# Patient Record
Sex: Female | Born: 1950 | Race: Black or African American | Hispanic: No | Marital: Married | Smoking: Never smoker
Health system: Southern US, Community
[De-identification: ages and names within clinical notes are randomized; demographics above are authoritative.]

## PROBLEM LIST (undated history)

## (undated) DIAGNOSIS — I10 Essential (primary) hypertension: Secondary | ICD-10-CM

## (undated) HISTORY — PX: ABDOMINAL HYSTERECTOMY: SHX81

## (undated) HISTORY — PX: REPLACEMENT TOTAL KNEE: SUR1224

## (undated) HISTORY — PX: CATARACT EXTRACTION, BILATERAL: SHX1313

## (undated) HISTORY — PX: FOOT SURGERY: SHX648

---

## 2020-01-07 LAB — EXTERNAL GENERIC LAB PROCEDURE: COLOGUARD: NEGATIVE

## 2020-01-07 LAB — COLOGUARD: COLOGUARD: NEGATIVE

## 2020-08-14 ENCOUNTER — Emergency Department (HOSPITAL_BASED_OUTPATIENT_CLINIC_OR_DEPARTMENT_OTHER): Payer: Medicare Other

## 2020-08-14 ENCOUNTER — Other Ambulatory Visit: Payer: Self-pay

## 2020-08-14 ENCOUNTER — Inpatient Hospital Stay (HOSPITAL_BASED_OUTPATIENT_CLINIC_OR_DEPARTMENT_OTHER)
Admission: EM | Admit: 2020-08-14 | Discharge: 2020-08-16 | DRG: 177 | Disposition: A | Discharge status: Home / Self Care | Payer: Medicare Other | Attending: Internal Medicine | Admitting: Internal Medicine

## 2020-08-14 ENCOUNTER — Encounter (HOSPITAL_BASED_OUTPATIENT_CLINIC_OR_DEPARTMENT_OTHER): Payer: Self-pay | Admitting: Emergency Medicine

## 2020-08-14 DIAGNOSIS — E669 Obesity, unspecified: Secondary | ICD-10-CM | POA: Diagnosis present

## 2020-08-14 DIAGNOSIS — U071 COVID-19: Secondary | ICD-10-CM

## 2020-08-14 DIAGNOSIS — Z6838 Body mass index (BMI) 38.0-38.9, adult: Secondary | ICD-10-CM

## 2020-08-14 DIAGNOSIS — Z90710 Acquired absence of both cervix and uterus: Secondary | ICD-10-CM

## 2020-08-14 DIAGNOSIS — Z7982 Long term (current) use of aspirin: Secondary | ICD-10-CM

## 2020-08-14 DIAGNOSIS — Z79899 Other long term (current) drug therapy: Secondary | ICD-10-CM

## 2020-08-14 DIAGNOSIS — E785 Hyperlipidemia, unspecified: Secondary | ICD-10-CM | POA: Diagnosis present

## 2020-08-14 DIAGNOSIS — Z7984 Long term (current) use of oral hypoglycemic drugs: Secondary | ICD-10-CM

## 2020-08-14 DIAGNOSIS — R7303 Prediabetes: Secondary | ICD-10-CM | POA: Diagnosis present

## 2020-08-14 DIAGNOSIS — J1282 Pneumonia due to coronavirus disease 2019: Secondary | ICD-10-CM | POA: Diagnosis present

## 2020-08-14 DIAGNOSIS — I10 Essential (primary) hypertension: Secondary | ICD-10-CM | POA: Diagnosis present

## 2020-08-14 DIAGNOSIS — J9601 Acute respiratory failure with hypoxia: Secondary | ICD-10-CM | POA: Diagnosis present

## 2020-08-14 HISTORY — DX: Essential (primary) hypertension: I10

## 2020-08-14 LAB — COMPREHENSIVE METABOLIC PANEL
ALT: 24 U/L (ref 0–44)
AST: 26 U/L (ref 15–41)
Albumin: 4.2 g/dL (ref 3.5–5.0)
Alkaline Phosphatase: 97 U/L (ref 38–126)
Anion gap: 13 (ref 5–15)
BUN: 19 mg/dL (ref 8–23)
CO2: 27 mmol/L (ref 22–32)
Calcium: 9.7 mg/dL (ref 8.9–10.3)
Chloride: 99 mmol/L (ref 98–111)
Creatinine, Ser: 0.86 mg/dL (ref 0.44–1.00)
GFR, Estimated: >60 mL/min (ref >60)
Glucose, Bld: 95 mg/dL (ref 70–99)
Potassium: 3.8 mmol/L (ref 3.5–5.1)
Sodium: 139 mmol/L (ref 135–145)
Total Bilirubin: 0.2 mg/dL — ABNORMAL LOW (ref 0.3–1.2)
Total Protein: 7.9 g/dL (ref 6.5–8.1)

## 2020-08-14 LAB — CBC WITH DIFFERENTIAL/PLATELET
Abs Immature Granulocytes: 0.05 10*3/uL (ref 0.00–0.07)
Basophils Absolute: 0 10*3/uL (ref 0.0–0.1)
Basophils Relative: 0 %
Eosinophils Absolute: 0 10*3/uL (ref 0.0–0.5)
Eosinophils Relative: 0 %
HCT: 43 % (ref 36.0–46.0)
Hemoglobin: 13.8 g/dL (ref 12.0–15.0)
Immature Granulocytes: 0 %
Lymphocytes Relative: 26 %
Lymphs Abs: 3.5 10*3/uL (ref 0.7–4.0)
MCH: 29.4 pg (ref 26.0–34.0)
MCHC: 32.1 g/dL (ref 30.0–36.0)
MCV: 91.7 fL (ref 80.0–100.0)
Monocytes Absolute: 0.9 10*3/uL (ref 0.1–1.0)
Monocytes Relative: 7 %
Neutro Abs: 8.9 10*3/uL — ABNORMAL HIGH (ref 1.7–7.7)
Neutrophils Relative %: 67 %
Platelets: 256 10*3/uL (ref 150–400)
RBC: 4.69 MIL/uL (ref 3.87–5.11)
RDW: 12.7 % (ref 11.5–15.5)
WBC: 13.4 10*3/uL — ABNORMAL HIGH (ref 4.0–10.5)
nRBC: 0 % (ref 0.0–0.2)

## 2020-08-14 LAB — LACTIC ACID, PLASMA: Lactic Acid, Venous: 1.8 mmol/L (ref 0.5–1.9)

## 2020-08-14 LAB — RESP PANEL BY RT-PCR (FLU A&B, COVID) ARPGX2
Influenza A by PCR: NEGATIVE
Influenza B by PCR: NEGATIVE
SARS Coronavirus 2 by RT PCR: POSITIVE — AB

## 2020-08-14 LAB — FIBRINOGEN: Fibrinogen: 468 mg/dL (ref 210–475)

## 2020-08-14 LAB — C-REACTIVE PROTEIN: CRP: 0.8 mg/dL (ref <1.0)

## 2020-08-14 LAB — D-DIMER, QUANTITATIVE: D-Dimer, Quant: 0.43 ug/mL-FEU (ref 0.00–0.50)

## 2020-08-14 LAB — BRAIN NATRIURETIC PEPTIDE: B Natriuretic Peptide: 28.5 pg/mL (ref 0.0–100.0)

## 2020-08-14 LAB — FERRITIN: Ferritin: 30 ng/mL (ref 11–307)

## 2020-08-14 LAB — LACTATE DEHYDROGENASE: LDH: 199 U/L — ABNORMAL HIGH (ref 98–192)

## 2020-08-14 LAB — PROCALCITONIN: Procalcitonin: <0.1 ng/mL

## 2020-08-14 LAB — TRIGLYCERIDES: Triglycerides: 121 mg/dL (ref <150)

## 2020-08-14 MED ORDER — ASPIRIN 81 MG PO CHEW
81.0000 mg | CHEWABLE_TABLET | Freq: Once | ORAL | Status: AC
  Administered 2020-08-14: 81 mg via ORAL
  Filled 2020-08-14: qty 1

## 2020-08-14 MED ORDER — SODIUM CHLORIDE 0.9 % IV SOLN
100.0000 mg | INTRAVENOUS | Status: AC
  Administered 2020-08-14 (×2): 100 mg via INTRAVENOUS

## 2020-08-14 MED ORDER — ATORVASTATIN CALCIUM 40 MG PO TABS
40.0000 mg | ORAL_TABLET | Freq: Every day | ORAL | Status: DC
  Administered 2020-08-14 – 2020-08-16 (×3): 40 mg via ORAL
  Filled 2020-08-14 (×3): qty 1

## 2020-08-14 MED ORDER — ENALAPRIL MALEATE 10 MG PO TABS
10.0000 mg | ORAL_TABLET | Freq: Every day | ORAL | Status: DC
  Administered 2020-08-14 – 2020-08-16 (×3): 10 mg via ORAL
  Filled 2020-08-14: qty 1
  Filled 2020-08-14 (×3): qty 2

## 2020-08-14 MED ORDER — ALBUTEROL SULFATE HFA 108 (90 BASE) MCG/ACT IN AERS
8.0000 | INHALATION_SPRAY | Freq: Once | RESPIRATORY_TRACT | Status: AC
  Administered 2020-08-14: 8 via RESPIRATORY_TRACT
  Filled 2020-08-14: qty 6.7

## 2020-08-14 MED ORDER — ACETAMINOPHEN 325 MG PO TABS
650.0000 mg | ORAL_TABLET | ORAL | Status: DC | PRN
  Administered 2020-08-14 – 2020-08-16 (×3): 650 mg via ORAL
  Filled 2020-08-14 (×3): qty 2

## 2020-08-14 MED ORDER — LABETALOL HCL 200 MG PO TABS
200.0000 mg | ORAL_TABLET | Freq: Two times a day (BID) | ORAL | Status: DC
  Administered 2020-08-15 – 2020-08-16 (×3): 200 mg via ORAL
  Filled 2020-08-14 (×6): qty 1

## 2020-08-14 MED ORDER — ACETAMINOPHEN 325 MG PO TABS: 650.0000 mg | ORAL_TABLET | Freq: Once | ORAL | Status: AC

## 2020-08-14 MED ORDER — SODIUM CHLORIDE 0.9 % IV SOLN
100.0000 mg | Freq: Every day | INTRAVENOUS | Status: DC
  Administered 2020-08-15 – 2020-08-16 (×2): 100 mg via INTRAVENOUS
  Filled 2020-08-14: qty 20

## 2020-08-14 MED ORDER — METFORMIN HCL 500 MG PO TABS
1000.0000 mg | ORAL_TABLET | Freq: Every day | ORAL | Status: DC
  Administered 2020-08-15 – 2020-08-16 (×2): 1000 mg via ORAL
  Filled 2020-08-14 (×2): qty 2

## 2020-08-14 MED ORDER — AMLODIPINE BESYLATE 5 MG PO TABS
5.0000 mg | ORAL_TABLET | Freq: Every day | ORAL | Status: DC
  Administered 2020-08-14 – 2020-08-16 (×3): 5 mg via ORAL
  Filled 2020-08-14 (×3): qty 1

## 2020-08-14 MED ORDER — ACETAMINOPHEN 325 MG PO TABS
ORAL_TABLET | ORAL | Status: AC
  Administered 2020-08-14: 650 mg via ORAL
  Filled 2020-08-14: qty 2

## 2020-08-14 MED ORDER — DEXAMETHASONE SODIUM PHOSPHATE 10 MG/ML IJ SOLN
6.0000 mg | Freq: Once | INTRAMUSCULAR | Status: AC
  Administered 2020-08-14: 6 mg via INTRAVENOUS
  Filled 2020-08-14: qty 1

## 2020-08-14 NOTE — Progress Notes (Signed)
70 year old female with history of noninsulin-dependent type 2 diabetes, hypertension, hyperlipidemia presented with covid 19 symptoms for the past 1 and half weeks, reports sister passed away from Covid recently, she is tested positive for covid 19, Chest x-ray no acute infiltrate, hypoxia 87% on room air, inflammatory marker pending, she received remdesivir and steroid in the ED, accepted to med telemetry, currently no bed at [facility] or [facility], she is accepted to either campus first available.

## 2020-08-14 NOTE — ED Provider Notes (Signed)
[facility] EMERGENCY DEPARTMENT Provider Note   CSN: [id number] Arrival date & time: 08/14/20  0945     History Chief Complaint  Patient presents with  . Cough    Rhonda Gray is a 70 y.o. female with PMHx HTN who presents to the ED today with complaint of dry cough and nasal congestion with "sinuses" x 1.5 weeks.  Patient reports she has issues with her sinuses regularly.  She has been taking Tylenol cold and sinus without relief.  She states that her symptoms started a day after receiving the flu shot however she does also mention that her sister was recently admitted to the hospital on 2023-08-23 and ultimately passed away from COVID-19.  Patient is vaccinated with 2 doses however has not received her booster.  She denies any fevers or chills. No shortness of breath or chest pain.  The history is provided by the patient and medical records.       Past Medical History:  Diagnosis Date  . Hypertension     Patient Active Problem List   Diagnosis Date Noted  . COVID-19 08/14/2020    Past Surgical History:  Procedure Laterality Date  . ABDOMINAL HYSTERECTOMY       OB History   No obstetric history on file.     No family history on file.  Social History   Tobacco Use  . Smoking status: Never Smoker  . Smokeless tobacco: Never Used  Substance Use Topics  . Alcohol use: Not Currently    Home Medications Prior to Admission medications   Medication Sig Start Date End Date Taking? Authorizing Provider  bupivacaine (MARCAINE) 0.25 % injection Inject into the skin. 04/29/20  Yes [provider]  enalapril (VASOTEC) 10 MG tablet Take 10 mg by mouth daily.   Yes [provider]  labetalol (NORMODYNE) 200 MG tablet Take 200 mg by mouth 2 (two) times daily.   Yes [provider]  omeprazole (PRILOSEC) 40 MG capsule Take 40 mg by mouth daily.   Yes [provider]  amLODipine (NORVASC) 5 MG tablet Take by mouth.    [provider]  aspirin 81 MG EC tablet Take by mouth.    [provider]  atorvastatin (LIPITOR) 40 MG tablet Take 40 mg by mouth daily. 06/15/20   [provider]  Calcium Citrate-Vitamin D 250-200 MG-UNIT TABS Take by mouth.    [provider]  docusate sodium (COLACE) 100 MG capsule Take by mouth.    [provider]  estradiol (ESTRACE) 1 MG tablet Take 1 mg by mouth daily. 08/09/20   [provider]  metFORMIN (GLUCOPHAGE) 1000 MG tablet Take 1,000 mg by mouth daily. 05/31/20   [provider]    Allergies    Patient has no known allergies.  Review of Systems   Review of Systems  Constitutional: Negative for chills and fever.  HENT: Positive for congestion. Negative for ear pain and sore throat.   Respiratory: Positive for cough and wheezing. Negative for shortness of breath.   Neurological: Positive for headaches.    Physical Exam Updated Vital Signs BP (!) 168/61   Pulse 74   Temp 98.8 F (37.1 C) (Oral)   Resp 20   Ht 5' (1.524 m)   Wt 90.3 kg   SpO2 96%   BMI 38.86 kg/m   Physical Exam Vitals and nursing note reviewed.  Constitutional:      Appearance: She is obese. She is not ill-appearing or diaphoretic.  HENT:     Head: Normocephalic and atraumatic.     Nose: Congestion present.     Right Sinus: No maxillary sinus tenderness or frontal sinus tenderness.     Left Sinus: No maxillary sinus tenderness or frontal sinus tenderness.  Eyes:     Extraocular Movements: Extraocular movements intact.     Conjunctiva/sclera: Conjunctivae normal.     Pupils: Pupils are equal, round, and reactive to light.  Cardiovascular:     Rate and Rhythm: Normal rate and regular rhythm.  Pulmonary:     Effort: Pulmonary effort is normal.     Breath sounds: Wheezing present. No rhonchi or rales.     Comments: Speaking in full sentences without difficulty. Satting 96% on RA. Lungs with end expiratory wheezes throughout.  Skin:     General: Skin is warm and dry.     Coloration: Skin is not jaundiced.  Neurological:     General: No focal deficit present.     Mental Status: She is alert and oriented to person, place, and time. Mental status is at baseline.     ED Results / Procedures / Treatments   Labs (all labs ordered are listed, but only abnormal results are displayed) Labs Reviewed  RESP PANEL BY RT-PCR (FLU A&B, COVID) ARPGX2 - Abnormal; Notable for the following components:      Result Value   SARS Coronavirus 2 by RT PCR POSITIVE (*)    All other components within normal limits  CBC WITH DIFFERENTIAL/PLATELET - Abnormal; Notable for the following components:   WBC 13.4 (*)    Neutro Abs 8.9 (*)    All other components within normal limits  COMPREHENSIVE METABOLIC PANEL - Abnormal; Notable for the following components:   Total Bilirubin 0.2 (*)    All other components within normal limits  LACTATE DEHYDROGENASE - Abnormal; Notable for the following components:   LDH 199 (*)    All other components within normal limits  CULTURE, BLOOD (ROUTINE X 2)  CULTURE, BLOOD (ROUTINE X 2)  LACTIC ACID, PLASMA  D-DIMER, QUANTITATIVE (NOT AT [facility])  PROCALCITONIN  FERRITIN  TRIGLYCERIDES  FIBRINOGEN  C-REACTIVE PROTEIN  BRAIN NATRIURETIC PEPTIDE    EKG EKG Interpretation  Date/Time:  Saturday August 14 2020 12:17:44 EST Ventricular Rate:  64 PR Interval:    QRS Duration: 74 QT Interval:  398 QTC Calculation: 411 R Axis:   54 Text Interpretation: Sinus rhythm No old tracing to compare Confirmed by Meridee Score [phone]) on 08/14/2020 12:21:26 PM   Radiology DG Chest Port 1 View  Result Date: 08/14/2020 CLINICAL DATA:  Cough, congestion EXAM: PORTABLE CHEST 1 VIEW COMPARISON:  None. FINDINGS: Low lung volumes. Right base atelectasis. Left lung clear. Heart is normal size. No effusions or acute bony abnormality. IMPRESSION: Low lung volumes, right base atelectasis. Electronically Signed   By: Charlett Nose M.D.   On: 08/14/2020 12:08    Procedures Procedures (including critical care time)  Medications Ordered in ED Medications  remdesivir 100 mg in sodium chloride 0.9 % 100 mL IVPB (has no administration in time range)  albuterol (VENTOLIN HFA) 108 (90 Base) MCG/ACT inhaler 8 puff (8 puffs Inhalation Given 08/14/20 1202)  dexamethasone (DECADRON) injection 6 mg (6 mg Intravenous Given 08/14/20 1457)  remdesivir 100 mg in sodium chloride 0.9 % 100 mL IVPB (100 mg Intravenous New Bag/Given 08/14/20 1538)  acetaminophen (TYLENOL) tablet 650 mg (650 mg Oral Given 08/14/20 1539)    ED Course  I have reviewed the  triage vital signs and the nursing notes.  Pertinent labs & imaging results that were available during my care of the patient were reviewed by me and considered in my medical decision making (see chart for details).  Clinical Course as of 08/14/20 1705  Sat Aug 14, 2020  1335 SARS Coronavirus 2 by RT PCR(!): POSITIVE [MV]  [id number] 70 year old female here with cough and congestion for 1 week.  She is Covid vaccinated.  Unfortunately her sister recently passed away from Covid.  She is Covid positive here.  Room air sat is not bad but she desats with any type of ambulation.  Getting Covid labs and likely will need admission to the hospital for further management. [MB]    Clinical Course User Index [MB] Terrilee Files, MD [MV] Tanda Rockers, PA-C   MDM Rules/Calculators/A&P                          70 year old female who presents to the ED today complaining of cough, nasal congestion for the past 1.5 weeks secondary to receiving flu vaccine on 12/20 however was also around her sister who recently passed away from COVID-19.  She is vaccinated with 2 doses, no booster.  On arrival to the ED vitals are stable.  Patient is afebrile, nontachycardic and nontachypneic.  She appears to be in no acute distress.  She is noted to have nasal congestion on exam and diffuse end expiratory wheezes on  all lung fields.  She has no maxillary or frontal sinus tenderness palpation however reports she is dealing with "sinuses.".  Has no focal neuro deficits on exam today, I suspect she believes she is dealing with sinuses however she is having a headache and a cough.  Will test for Covid at this time as I have strong suspicion given her recent Covid exposure with her sister who ultimately passed away unfortunately.  We will also plan for chest x-ray.  Patient states she feels short of breath however she does not have any increased work of breathing on exam.  Will provide albuterol inhaler and reevaluate.  We will plan to ambulate patient to ensure she does not desaturate however EKG obtained due to complaint of shortness of breath.   Pt was ambulated and quickly desatted to 87% however once she sat back down it went back up. Given this feel she needs further work up - had initially sent the 6-24 hr COVID test however will change to 2 hour as pt may need admission.   COVID test has returned positive at this time. Do feel pt needs to be admitted. Will order COVID pre admission labs and consult for admission. Will order remdesivir in the setting of hypoxia with exertion and decadron.   Discussed case with Dr. Roda Shutters with [facility] who agrees to accept patient for admission.   This note was prepared using Dragon voice recognition software and may include unintentional dictation errors due to the inherent limitations of voice recognition software.  Daleysa Kristiansen was evaluated in Emergency Department on 08/14/2020 for the symptoms described in the history of present illness. She was evaluated in the context of the global COVID-19 pandemic, which necessitated consideration that the patient might be at risk for infection with the SARS-CoV-2 virus that causes COVID-19. Institutional protocols and algorithms that pertain to the evaluation of patients at risk for COVID-19 are in a state of rapid change based on  information released by regulatory bodies including the CDC and  federal and state organizations. These policies and algorithms were followed during the patient's care in the ED.  Final Clinical Impression(s) / ED Diagnoses Final diagnoses:  [id number]    Rx / DC Orders ED Discharge Orders    None       Eustaquio Maize, PA-C 08/14/20 1705    Hayden Rasmussen, MD 08/14/20 1742

## 2020-08-14 NOTE — Progress Notes (Signed)
Pt educated on MDI with spacer. 8 puffs given per MD order. Pt then ambulated on room air. Pre SpO2 was 95%, during ambulation SpO2 low as 88% and post SpO2 was 93%. Pt complains of SOB only with exertion. Pt also complains of congested, strong, non-productive cough. RT will continue to monitor and be available as needed.

## 2020-08-14 NOTE — ED Notes (Signed)
Pt took home dose 200 mg labetalol - will need another dose sent from pharmacy for AM

## 2020-08-14 NOTE — ED Notes (Signed)
Home dose labetalol not available - we do have dose of labetalol 200MG  IV - per Dr.Pickering, do not give IV. Nighttime dose held.

## 2020-08-14 NOTE — ED Triage Notes (Signed)
Cough and congestion x 1 week. Had her flu shot Monday. Fully vaccinated against covid.

## 2020-08-14 NOTE — ED Notes (Signed)
Family brought in meal for patient

## 2020-08-15 DIAGNOSIS — I10 Essential (primary) hypertension: Secondary | ICD-10-CM | POA: Diagnosis present

## 2020-08-15 DIAGNOSIS — J1282 Pneumonia due to coronavirus disease 2019: Secondary | ICD-10-CM | POA: Diagnosis present

## 2020-08-15 DIAGNOSIS — J9601 Acute respiratory failure with hypoxia: Secondary | ICD-10-CM | POA: Diagnosis present

## 2020-08-15 DIAGNOSIS — Z79899 Other long term (current) drug therapy: Secondary | ICD-10-CM | POA: Diagnosis not present

## 2020-08-15 DIAGNOSIS — U071 COVID-19: Secondary | ICD-10-CM | POA: Diagnosis present

## 2020-08-15 DIAGNOSIS — Z7984 Long term (current) use of oral hypoglycemic drugs: Secondary | ICD-10-CM | POA: Diagnosis not present

## 2020-08-15 DIAGNOSIS — E785 Hyperlipidemia, unspecified: Secondary | ICD-10-CM | POA: Diagnosis present

## 2020-08-15 DIAGNOSIS — Z90710 Acquired absence of both cervix and uterus: Secondary | ICD-10-CM | POA: Diagnosis not present

## 2020-08-15 DIAGNOSIS — R7303 Prediabetes: Secondary | ICD-10-CM | POA: Diagnosis present

## 2020-08-15 DIAGNOSIS — E669 Obesity, unspecified: Secondary | ICD-10-CM | POA: Diagnosis present

## 2020-08-15 DIAGNOSIS — Z6838 Body mass index (BMI) 38.0-38.9, adult: Secondary | ICD-10-CM | POA: Diagnosis not present

## 2020-08-15 DIAGNOSIS — Z7982 Long term (current) use of aspirin: Secondary | ICD-10-CM | POA: Diagnosis not present

## 2020-08-15 LAB — CBG MONITORING, ED: Glucose-Capillary: 106 mg/dL — ABNORMAL HIGH (ref 70–99)

## 2020-08-15 LAB — GLUCOSE, CAPILLARY: Glucose-Capillary: 137 mg/dL — ABNORMAL HIGH (ref 70–99)

## 2020-08-15 MED ORDER — DEXAMETHASONE 4 MG PO TABS
6.0000 mg | ORAL_TABLET | Freq: Every day | ORAL | Status: DC
  Administered 2020-08-16: 6 mg via ORAL
  Filled 2020-08-15: qty 2

## 2020-08-15 MED ORDER — POLYETHYLENE GLYCOL 3350 17 G PO PACK: 17.0000 g | PACK | Freq: Every day | ORAL | Status: DC | PRN

## 2020-08-15 MED ORDER — IPRATROPIUM-ALBUTEROL 20-100 MCG/ACT IN AERS
1.0000 | INHALATION_SPRAY | Freq: Four times a day (QID) | RESPIRATORY_TRACT | Status: DC | PRN
  Administered 2020-08-16: 1 via RESPIRATORY_TRACT
  Filled 2020-08-15: qty 4

## 2020-08-15 MED ORDER — GUAIFENESIN-DM 100-10 MG/5ML PO SYRP: 10.0000 mL | ORAL_SOLUTION | ORAL | Status: DC | PRN

## 2020-08-15 MED ORDER — PANTOPRAZOLE SODIUM 40 MG PO TBEC
80.0000 mg | DELAYED_RELEASE_TABLET | Freq: Every day | ORAL | Status: DC
  Administered 2020-08-16: 80 mg via ORAL
  Filled 2020-08-15: qty 2

## 2020-08-15 MED ORDER — BENZONATATE 100 MG PO CAPS
100.0000 mg | ORAL_CAPSULE | Freq: Three times a day (TID) | ORAL | Status: DC | PRN
  Administered 2020-08-15 – 2020-08-16 (×3): 100 mg via ORAL
  Filled 2020-08-15 (×3): qty 1

## 2020-08-15 MED ORDER — DOCUSATE SODIUM 100 MG PO CAPS
100.0000 mg | ORAL_CAPSULE | Freq: Every day | ORAL | Status: DC
  Administered 2020-08-16: 100 mg via ORAL
  Filled 2020-08-15 (×2): qty 1

## 2020-08-15 MED ORDER — ONDANSETRON HCL 4 MG PO TABS: 4.0000 mg | ORAL_TABLET | Freq: Four times a day (QID) | ORAL | Status: DC | PRN

## 2020-08-15 MED ORDER — INSULIN ASPART 100 UNIT/ML ~~LOC~~ SOLN: 0.0000 [IU] | Freq: Every day | SUBCUTANEOUS | Status: DC

## 2020-08-15 MED ORDER — INSULIN ASPART 100 UNIT/ML ~~LOC~~ SOLN: 0.0000 [IU] | Freq: Three times a day (TID) | SUBCUTANEOUS | Status: DC

## 2020-08-15 MED ORDER — ENOXAPARIN SODIUM 40 MG/0.4ML ~~LOC~~ SOLN
40.0000 mg | SUBCUTANEOUS | Status: DC
  Administered 2020-08-15: 40 mg via SUBCUTANEOUS
  Filled 2020-08-15: qty 0.4

## 2020-08-15 MED ORDER — ONDANSETRON HCL 4 MG/2ML IJ SOLN: 4.0000 mg | Freq: Four times a day (QID) | INTRAMUSCULAR | Status: DC | PRN

## 2020-08-15 NOTE — Progress Notes (Signed)
Yellow MEWS score.  Assessment done. Patient stable and asymptomatic. Admitting MD came and seen patient. Charge nurse, made aware. Will monitor patient according to [place] guidelines.   08/15/20 2010  Assess: MEWS Score  Temp 98.2 F (36.8 C)  BP (!) 204/109  Pulse Rate 80  Resp 16  Level of Consciousness Alert  SpO2 100 %  O2 Device Nasal Cannula  Assess: MEWS Score  MEWS Temp 0  MEWS Systolic 2  MEWS Pulse 0  MEWS RR 0  MEWS LOC 0  MEWS Score 2  MEWS Score Color Yellow

## 2020-08-15 NOTE — H&P (Signed)
History and Physical  Patient Name: Rhonda Gray     TDD:[id number]    DOB: 01-Jul-1951    DOA: 08/14/2020 PCP: Egbert Garibaldi, PA-C  Patient coming from: Home  Chief Complaint: Cough, nasal congestion for the past week and a half   HPI: Rhonda Gray is a 70 y.o. female with hx of hypertension, prediabetes, obesity, who initially presented to outside facility on 08/14/2020 secondary to cough and nasal congestion, was found to be Covid positive.  Patient states for the past week and a half she has had dry cough, facial/sinus congestion that has not improved despite over-the-counter medications, which it typically does with routine flares.  She has had issues with her sinuses in the past.  She has had 2 doses of Covid vaccine but did not receive her booster yet.  Denies any history of asthma, smoking, or any pulmonary disease.  She does not use oxygen or inhalers normally.  She has mild dyspnea on exertion.  Her sister recently passed away from Covid, however she says she was not by her lately.  She denies any chest pain, diarrhea, fevers.  ED course: -My initial presentation, patient afebrile, heart rate in the 80s, respiratory rate 20, blood pressure 150/80, O2 sat 92% on room air. -Relevant labs on initial presentation: Sodium 139, potassium 3.8, bicarb 27, BUN 19, creatinine 0.86, LDH 199, BNP 28, ferritin 30, CRP 0.8, procalcitonin negative, WBC 13.4, hemoglobin 13.8 -She was given remdesivir, Decadron, Tylenol, albuterol. -Given her dyspnea and desaturation (87%) with ambulation, patient was transferred for admission.     ROS: A complete and thorough review of systems obtained, negative unless stated in the HPI.      Past Medical History:  Diagnosis Date  . Hypertension     Past Surgical History:  Procedure Laterality Date  . ABDOMINAL HYSTERECTOMY      Social History: Patient lives at home.  The patient walks without assistance.  Non--smoker.  Allergies  Allergen Reactions   . Pedi-Pre Tape Spray [Wound Dressing Adhesive] Rash    PAPER TAPE GIVES A RASH, PLASTIC TAPE IS FINE TO USE    Family history: family history is not on file.  Prior to Admission medications   Medication Sig Start Date End Date Taking? Authorizing Provider  amLODipine (NORVASC) 5 MG tablet Take 5 mg by mouth daily.   Yes [provider]  aspirin 81 MG EC tablet Take 81 mg by mouth every evening.   Yes [provider]  atorvastatin (LIPITOR) 40 MG tablet Take 40 mg by mouth daily. 06/15/20  Yes [provider]  Calcium Citrate-Vitamin D 250-200 MG-UNIT TABS Take by mouth.   Yes [provider]  docusate sodium (COLACE) 100 MG capsule Take 100 mg by mouth daily.   Yes [provider]  enalapril (VASOTEC) 10 MG tablet Take 10 mg by mouth 2 (two) times daily.   Yes [provider]  estradiol (ESTRACE) 1 MG tablet Take 1 mg by mouth daily. 08/09/20  Yes [provider]  labetalol (NORMODYNE) 200 MG tablet Take 200 mg by mouth 2 (two) times daily.   Yes [provider]  metFORMIN (GLUCOPHAGE) 1000 MG tablet Take 1,000 mg by mouth daily. 05/31/20  Yes [provider]  omeprazole (PRILOSEC) 40 MG capsule Take 40 mg by mouth daily.   Yes [provider]  pioglitazone (ACTOS) 15 MG tablet Take 15 mg by mouth daily.   Yes [provider]       Physical Exam:  BP (!) 149/65 (BP Location: Right Arm)   Pulse 87   Temp 99 F (37.2 C) (Oral)   Resp 18   Ht 5' (1.524 m)   Wt 85.9 kg   SpO2 100%   BMI 36.98 kg/m  General appearance: Well-developed, adult female, alert and in  no acute respiratory distress Eyes: Anicteric, conjunctiva pink, lids and lashes normal. PERRL.    ENT: No nasal deformity, discharge, epistaxis.  Hearing intact.  Neck: No neck masses.  Trachea midline.  No thyromegaly/tenderness. Lymph: No cervical or supraclavicular lymphadenopathy. Skin: Warm and dry.  No jaundice.  No  suspicious rashes or lesions. Cardiac: RRR, nl S1-S2, no murmurs appreciated.. Trace LE edema.  Radial and pedal pulses 2+ and symmetric. Respiratory: Diminished breath sounds bilaterally, no significant wheezing noted Abdomen: Abdomen soft.  Nontender, bowel sounds present.  No ascites, distension, hepatosplenomegaly.   MSK: No deformities or effusions of the large joints of the upper or lower extremities bilaterally.  No cyanosis or clubbing. Neuro: Cranial nerves 2 through 12 grossly intact.  Sensation intact to light touch. Speech is fluent.  Muscle strength intact.    Psych: Sensorium intact and responding to questions, attention normal.  Behavior appropriate.  Affect within normal limits.  Judgment and insight appear normal.     Labs on Admission:  I have personally reviewed following labs and imaging studies: CBC: Recent Labs  Lab 08/14/20 1331  WBC 13.4*  NEUTROABS 8.9*  HGB 13.8  HCT 43.0  MCV 91.7  PLT 256   Basic Metabolic Panel: Recent Labs  Lab 08/14/20 1331  NA 139  K 3.8  CL 99  CO2 27  GLUCOSE 95  BUN 19  CREATININE 0.86  CALCIUM 9.7   GFR: Estimated Creatinine Clearance: 61.8 mL/min (by C-G formula based on SCr of 0.86 mg/dL).  Liver Function Tests: Recent Labs  Lab 08/14/20 1331  AST 26  ALT 24  ALKPHOS 97  BILITOT 0.2*  PROT 7.9  ALBUMIN 4.2   No results for input(s): LIPASE, AMYLASE in the last 168 hours. No results for input(s): AMMONIA in the last 168 hours. Coagulation Profile: No results for input(s): INR, PROTIME in the last 168 hours. Cardiac Enzymes: No results for input(s): CKTOTAL, CKMB, CKMBINDEX, TROPONINI in the last 168 hours. BNP (last 3 results) No results for input(s): PROBNP in the last 8760 hours. HbA1C: No results for input(s): HGBA1C in the last 72 hours. CBG: Recent Labs  Lab 08/15/20 0816 08/15/20 2145  GLUCAP 106* 137*   Lipid Profile: Recent Labs    08/14/20 1331  TRIG 121   Thyroid Function  Tests: No results for input(s): TSH, T4TOTAL, FREET4, T3FREE, THYROIDAB in the last 72 hours. Anemia Panel: Recent Labs    08/14/20 1331  FERRITIN 30     Recent Results (from the past 240 hour(s))  Resp Panel by RT-PCR (Flu A&B, Covid) Nasopharyngeal Swab     Status: Abnormal   Collection Time: 08/14/20 11:46 AM   Specimen: Nasopharyngeal Swab; Nasopharyngeal(NP) swabs in vial transport medium  Result Value Ref Range Status   SARS Coronavirus 2 by RT PCR POSITIVE (A) NEGATIVE Final    Comment: RESULT CALLED TO, READ BACK BY AND VERIFIED WITH: Heywood Bene RN [phone] [id number] PHILLIPS C (NOTE) SARS-CoV-2 target nucleic acids are DETECTED.  The SARS-CoV-2 RNA is generally detectable in upper respiratory specimens during the acute phase of infection. Positive results are indicative of the presence of the identified virus, but do not rule out bacterial  infection or co-infection with other pathogens not detected by the test. Clinical correlation with patient history and other diagnostic information is necessary to determine patient infection status. The expected result is Negative.  Fact Sheet for Patients: BloggerCourse.com  Fact Sheet for Healthcare Providers: SeriousBroker.it  This test is not yet approved or cleared by the [place] FDA and  has been authorized for detection and/or diagnosis of SARS-CoV-2 by FDA under an Emergency Use Authorization (EUA).  This EUA will remain in effect (meaning this test can be  used) for the duration of  the COVID-19 declaration under Section 564(b)(1) of the Act, 21 U.S.C. section 360bbb-3(b)(1), unless the authorization is terminated or revoked sooner.     Influenza A by PCR NEGATIVE NEGATIVE Final   Influenza B by PCR NEGATIVE NEGATIVE Final    Comment: (NOTE) The Xpert Xpress SARS-CoV-2/FLU/RSV plus assay is intended as an aid in the diagnosis of influenza from Nasopharyngeal swab  specimens and should not be used as a sole basis for treatment. Nasal washings and aspirates are unacceptable for Xpert Xpress SARS-CoV-2/FLU/RSV testing.  Fact Sheet for Patients: BloggerCourse.com  Fact Sheet for Healthcare Providers: SeriousBroker.it  This test is not yet approved or cleared by the [place] FDA and has been authorized for detection and/or diagnosis of SARS-CoV-2 by FDA under an Emergency Use Authorization (EUA). This EUA will remain in effect (meaning this test can be used) for the duration of the COVID-19 declaration under Section 564(b)(1) of the Act, 21 U.S.C. section 360bbb-3(b)(1), unless the authorization is terminated or revoked.  Performed at [facility], [address] [place], [place] [address]   Blood Culture (routine x 2)     Status: None (Preliminary result)   Collection Time: 08/14/20  1:32 PM   Specimen: BLOOD  Result Value Ref Range Status   Specimen Description   Final    BLOOD RIGHT ARM Performed at [facility], [address] [place], [place] [address]    Special Requests   Final    BOTTLES DRAWN AEROBIC AND ANAEROBIC Blood Culture adequate volume Performed at [facility], [address] [place], [place] [address]    Culture   Final    NO GROWTH < 24 HOURS Performed at [facility], [address], [place], [place] [address]    Report Status PENDING  Incomplete           Radiological Exams on Admission: Personally reviewed: No significant consolidation seen DG Chest Port 1 View  Result Date: 08/14/2020 CLINICAL DATA:  Cough, congestion EXAM: PORTABLE CHEST 1 VIEW COMPARISON:  None. FINDINGS: Low lung volumes. Right base atelectasis. Left lung clear. Heart is normal size. No effusions or acute bony abnormality. IMPRESSION: Low lung volumes, right base atelectasis. Electronically Signed   By: Charlett Nose M.D.   On: 08/14/2020 12:08       Assessment/Plan   1.  Acute hypoxic respiratory failure secondary to Covid pneumonitis -Patient desats 87% with ambulation -Covid positive on 08/14/2020 -Continue remdesivir, Decadron -breathing treatments and antitussives as needed -Given procalcitonin 0, and felt signs and symptoms point to viral pneumonia, we will not start antibiotics -Wean O2 as tolerated -Trend inflammatory markers  2.  Viral pneumonia secondary to Covid -See further plans above  3.  Essential hypertension -Resume home Norvasc, enalapril, labetalol -Continue to monitor blood pressure closely  4.  Prediabetes -Glucose checks, sliding scale ordered -Hemoglobin A1c ordered -Continue home metformin -ADA  diet     DVT prophylaxis: Lovenox Code Status: Full Family Communication: None Disposition Plan: Anticipate discharge home when stable Consults called: None Admission status: Inpatient      Medical decision making: Patient seen at 11:50 PM on 08/15/2020 What exists of the patient's chart was reviewed in depth and summarized above.  Clinical condition: Monitoring.        Laqueta Due [facility] Please page though AMION or Epic secure chat:  For password, contact charge nurse

## 2020-08-15 NOTE — Plan of Care (Signed)

## 2020-08-16 DIAGNOSIS — J1282 Pneumonia due to coronavirus disease 2019: Secondary | ICD-10-CM | POA: Diagnosis not present

## 2020-08-16 DIAGNOSIS — U071 COVID-19: Secondary | ICD-10-CM | POA: Diagnosis not present

## 2020-08-16 LAB — GLUCOSE, CAPILLARY
Glucose-Capillary: 102 mg/dL — ABNORMAL HIGH (ref 70–99)
Glucose-Capillary: 97 mg/dL (ref 70–99)

## 2020-08-16 LAB — CBC WITH DIFFERENTIAL/PLATELET
Abs Immature Granulocytes: 0.05 10*3/uL (ref 0.00–0.07)
Basophils Absolute: 0 10*3/uL (ref 0.0–0.1)
Basophils Relative: 0 %
Eosinophils Absolute: 0 10*3/uL (ref 0.0–0.5)
Eosinophils Relative: 0 %
HCT: 42.1 % (ref 36.0–46.0)
Hemoglobin: 13.3 g/dL (ref 12.0–15.0)
Immature Granulocytes: 0 %
Lymphocytes Relative: 31 %
Lymphs Abs: 4.6 10*3/uL — ABNORMAL HIGH (ref 0.7–4.0)
MCH: 29.6 pg (ref 26.0–34.0)
MCHC: 31.6 g/dL (ref 30.0–36.0)
MCV: 93.8 fL (ref 80.0–100.0)
Monocytes Absolute: 1 10*3/uL (ref 0.1–1.0)
Monocytes Relative: 6 %
Neutro Abs: 9.3 10*3/uL — ABNORMAL HIGH (ref 1.7–7.7)
Neutrophils Relative %: 63 %
Platelets: 246 10*3/uL (ref 150–400)
RBC: 4.49 MIL/uL (ref 3.87–5.11)
RDW: 12.4 % (ref 11.5–15.5)
WBC: 15 10*3/uL — ABNORMAL HIGH (ref 4.0–10.5)
nRBC: 0 % (ref 0.0–0.2)

## 2020-08-16 LAB — COMPREHENSIVE METABOLIC PANEL
ALT: 21 U/L (ref 0–44)
AST: 22 U/L (ref 15–41)
Albumin: 3.5 g/dL (ref 3.5–5.0)
Alkaline Phosphatase: 79 U/L (ref 38–126)
Anion gap: 10 (ref 5–15)
BUN: 20 mg/dL (ref 8–23)
CO2: 25 mmol/L (ref 22–32)
Calcium: 8.6 mg/dL — ABNORMAL LOW (ref 8.9–10.3)
Chloride: 106 mmol/L (ref 98–111)
Creatinine, Ser: 0.79 mg/dL (ref 0.44–1.00)
GFR, Estimated: >60 mL/min (ref >60)
Glucose, Bld: 99 mg/dL (ref 70–99)
Potassium: 3.7 mmol/L (ref 3.5–5.1)
Sodium: 141 mmol/L (ref 135–145)
Total Bilirubin: 0.4 mg/dL (ref 0.3–1.2)
Total Protein: 6.7 g/dL (ref 6.5–8.1)

## 2020-08-16 LAB — HEMOGLOBIN A1C
Hgb A1c MFr Bld: 5.7 % — ABNORMAL HIGH (ref 4.8–5.6)
Mean Plasma Glucose: 116.89 mg/dL

## 2020-08-16 LAB — D-DIMER, QUANTITATIVE: D-Dimer, Quant: <0.27 ug/mL-FEU (ref 0.00–0.50)

## 2020-08-16 LAB — PHOSPHORUS: Phosphorus: 3.2 mg/dL (ref 2.5–4.6)

## 2020-08-16 LAB — C-REACTIVE PROTEIN: CRP: 0.8 mg/dL (ref <1.0)

## 2020-08-16 LAB — FERRITIN: Ferritin: 25 ng/mL (ref 11–307)

## 2020-08-16 LAB — HIV ANTIBODY (ROUTINE TESTING W REFLEX): HIV Screen 4th Generation wRfx: NONREACTIVE

## 2020-08-16 LAB — MAGNESIUM: Magnesium: 1.7 mg/dL (ref 1.7–2.4)

## 2020-08-16 MED ORDER — FLUTICASONE PROPIONATE 50 MCG/ACT NA SUSP
1.0000 | Freq: Every day | NASAL | Status: DC
  Administered 2020-08-16: 1 via NASAL
  Filled 2020-08-16: qty 16

## 2020-08-16 NOTE — Progress Notes (Incomplete)
Patient's MEWS score Yellow. No changes from previous assessment. Vital signs c    08/16/20 0613  Assess: MEWS Score  Temp 98.8 F (37.1 C)  BP (!) 218/100  Pulse Rate 98  Resp 18  Level of Consciousness Alert  SpO2 100 %  O2 Device Room Air  Assess: MEWS Score  MEWS Temp 0  MEWS Systolic 2  MEWS Pulse 0  MEWS RR 0  MEWS LOC 0  MEWS Score 2  MEWS Score Color Yellow

## 2020-08-16 NOTE — Progress Notes (Signed)
SATURATION QUALIFICATIONS: (This note is used to comply with regulatory documentation for home oxygen)  Patient Saturations on Room Air at Rest = 97%  Patient Saturations on Room Air while Ambulating = 93%  Patient Saturations on 0 Liters of oxygen while Ambulating =93%

## 2020-08-16 NOTE — Discharge Summary (Signed)
Physician Discharge Summary  Rhonda Gray BJS:[id number] DOB: Apr 18, 1951 DOA: 08/14/2020  PCP: Doreen Salvage, PA-C  Admit date: 08/14/2020 Discharge date: 08/16/2020  Admitted From: Home Disposition: Home  Recommendations for Outpatient Follow-up:  1. Follow up with PCP in 1-2 weeks 2. Please obtain BMP/CBC in one week 3. Please follow up on the following pending results:  Home Health: None Equipment/Devices: None   Discharge Condition: Stable CODE STATUS: Full Diet recommendation: Diabetic diet  Brief/Interim Summary: Rhonda Gray is a 71 y.o. female with hx of hypertension, prediabetes, obesity, who initially presented to outside facility on 08/14/2020 secondary to cough and nasal congestion, was found to be Covid positive. Patient states for the past week and a half she has had dry cough, facial/sinus congestion that has not improved despite over-the-counter medications, which it typically does with routine flares.  She has had issues with her sinuses in the past.  She has had 2 doses of Covid vaccine but did not receive her booster yet.  Denies any history of asthma, smoking, or any pulmonary disease. She does not use oxygen or inhalers normally.  She has mild dyspnea on exertion. Her sister recently passed away from Covid, however she says she was not by her lately. She denies any chest pain, diarrhea, fevers.  Patient admitted as above with acute pneumonia and acute hypoxia in setting of COVID-19 pneumonia.  Fortunately due to patient's Covid vaccination status she improved drastically with supportive care, she did receive Remdesivir and steroids per protocol, but given her rapid improvement she is now on room air even with exertion denies any other symptoms feels back to baseline is otherwise stable and agreeable for discharge home.  Close follow-up with PCP in 1 to 2 weeks, we discussed ongoing need for quarantine to ensure no further spread of Covid to her family, coworkers or  acquaintances..  Discharge Diagnoses:  Principal Problem:   Pneumonia due to COVID-19 virus Active Problems:   Essential hypertension   Prediabetes   Acute respiratory failure with hypoxia [facility])    Discharge Instructions  Discharge Instructions    Call MD for:  difficulty breathing, headache or visual disturbances   Complete by: As directed    Diet - low sodium heart healthy   Complete by: As directed    Increase activity slowly   Complete by: As directed      Allergies as of 08/16/2020      Reactions   Pedi-pre Tape Spray [wound Dressing Adhesive] Rash   PAPER TAPE GIVES A RASH, PLASTIC TAPE IS FINE TO USE      Medication List    TAKE these medications   amLODipine 5 MG tablet Commonly known as: NORVASC Take 5 mg by mouth daily.   aspirin 81 MG EC tablet Take 81 mg by mouth every evening.   atorvastatin 40 MG tablet Commonly known as: LIPITOR Take 40 mg by mouth daily.   Calcium Citrate-Vitamin D 250-200 MG-UNIT Tabs Take by mouth.   docusate sodium 100 MG capsule Commonly known as: COLACE Take 100 mg by mouth daily.   enalapril 10 MG tablet Commonly known as: VASOTEC Take 10 mg by mouth 2 (two) times daily.   estradiol 1 MG tablet Commonly known as: ESTRACE Take 1 mg by mouth daily.   labetalol 200 MG tablet Commonly known as: NORMODYNE Take 200 mg by mouth 2 (two) times daily.   metFORMIN 1000 MG tablet Commonly known as: GLUCOPHAGE Take 1,000 mg by mouth daily.   omeprazole 40 MG  capsule Commonly known as: PRILOSEC Take 40 mg by mouth daily.   pioglitazone 15 MG tablet Commonly known as: ACTOS Take 15 mg by mouth daily.       Allergies  Allergen Reactions  . Pedi-Pre Tape Spray [Wound Dressing Adhesive] Rash    PAPER TAPE GIVES A RASH, PLASTIC TAPE IS FINE TO USE    Consultations:  None   Procedures/Studies: DG Chest Port 1 View  Result Date: 08/14/2020 CLINICAL DATA:  Cough, congestion EXAM: PORTABLE CHEST 1 VIEW COMPARISON:   None. FINDINGS: Low lung volumes. Right base atelectasis. Left lung clear. Heart is normal size. No effusions or acute bony abnormality. IMPRESSION: Low lung volumes, right base atelectasis. Electronically Signed   By: Charlett Nose M.D.   On: 08/14/2020 12:08     Subjective: No acute issues/events overnight, shortness of breath resolved. Feels quite well.   Discharge Exam: Vitals:   08/16/20 0658 08/16/20 1010  BP: (!) 194/81 (!) 198/79  Pulse: 81 95  Resp:  16  Temp:  98.9 F (37.2 C)  SpO2:  97%   Vitals:   08/16/20 0613 08/16/20 0655 08/16/20 0658 08/16/20 1010  BP: (!) 218/100  (!) 194/81 (!) 198/79  Pulse: 98  81 95  Resp: 18   16  Temp: 98.8 F (37.1 C)   98.9 F (37.2 C)  TempSrc: Oral   Oral  SpO2: 100% 95%  97%  Weight:      Height:        General: Pt is alert, awake, not in acute distress Cardiovascular: RRR, S1/S2 +, no rubs, no gallops Respiratory: CTA bilaterally, no wheezing, no rhonchi Abdominal: Soft, NT, ND, bowel sounds + Extremities: no edema, no cyanosis    The results of significant diagnostics from this hospitalization (including imaging, microbiology, ancillary and laboratory) are listed below for reference.     Microbiology: Recent Results (from the past 240 hour(s))  Resp Panel by RT-PCR (Flu A&B, Covid) Nasopharyngeal Swab     Status: Abnormal   Collection Time: 08/14/20 11:46 AM   Specimen: Nasopharyngeal Swab; Nasopharyngeal(NP) swabs in vial transport medium  Result Value Ref Range Status   SARS Coronavirus 2 by RT PCR POSITIVE (A) NEGATIVE Final    Comment: RESULT CALLED TO, READ BACK BY AND VERIFIED WITH: Heywood Bene RN [phone] [id number] PHILLIPS C (NOTE) SARS-CoV-2 target nucleic acids are DETECTED.  The SARS-CoV-2 RNA is generally detectable in upper respiratory specimens during the acute phase of infection. Positive results are indicative of the presence of the identified virus, but do not rule out bacterial infection or co-infection  with other pathogens not detected by the test. Clinical correlation with patient history and other diagnostic information is necessary to determine patient infection status. The expected result is Negative.  Fact Sheet for Patients: BloggerCourse.com  Fact Sheet for Healthcare Providers: SeriousBroker.it  This test is not yet approved or cleared by the [place] FDA and  has been authorized for detection and/or diagnosis of SARS-CoV-2 by FDA under an Emergency Use Authorization (EUA).  This EUA will remain in effect (meaning this test can be  used) for the duration of  the COVID-19 declaration under Section 564(b)(1) of the Act, 21 U.S.C. section 360bbb-3(b)(1), unless the authorization is terminated or revoked sooner.     Influenza A by PCR NEGATIVE NEGATIVE Final   Influenza B by PCR NEGATIVE NEGATIVE Final    Comment: (NOTE) The Xpert Xpress SARS-CoV-2/FLU/RSV plus assay is intended as an aid in the diagnosis of  influenza from Nasopharyngeal swab specimens and should not be used as a sole basis for treatment. Nasal washings and aspirates are unacceptable for Xpert Xpress SARS-CoV-2/FLU/RSV testing.  Fact Sheet for Patients: EntrepreneurPulse.com.au  Fact Sheet for Healthcare Providers: IncredibleEmployment.be  This test is not yet approved or cleared by the [place] FDA and has been authorized for detection and/or diagnosis of SARS-CoV-2 by FDA under an Emergency Use Authorization (EUA). This EUA will remain in effect (meaning this test can be used) for the duration of the COVID-19 declaration under Section 564(b)(1) of the Act, 21 U.S.C. section 360bbb-3(b)(1), unless the authorization is terminated or revoked.  Performed at [facility], [address] [place], [place] [address]   Blood Culture (routine x 2)     Status: None (Preliminary result)   Collection  Time: 08/14/20  1:32 PM   Specimen: BLOOD  Result Value Ref Range Status   Specimen Description   Final    BLOOD RIGHT ARM Performed at [facility], [address] [place], [place] [address]    Special Requests   Final    BOTTLES DRAWN AEROBIC AND ANAEROBIC Blood Culture adequate volume Performed at [facility], [address] [place], [place] [address]    Culture   Final    NO GROWTH < 24 HOURS Performed at [facility], [address] [place], [place] [address]    Report Status PENDING  Incomplete     Labs: BNP (last 3 results) Recent Labs    08/14/20 1331  BNP 00.3   Basic Metabolic Panel: Recent Labs  Lab 08/14/20 1331 08/16/20 0428  NA 139 141  K 3.8 3.7  CL 99 106  CO2 27 25  GLUCOSE 95 99  BUN 19 20  CREATININE 0.86 0.79  CALCIUM 9.7 8.6*  MG  --  1.7  PHOS  --  3.2   Liver Function Tests: Recent Labs  Lab 08/14/20 1331 08/16/20 0428  AST 26 22  ALT 24 21  ALKPHOS 97 79  BILITOT 0.2* 0.4  PROT 7.9 6.7  ALBUMIN 4.2 3.5   No results for input(s): LIPASE, AMYLASE in the last 168 hours. No results for input(s): AMMONIA in the last 168 hours. CBC: Recent Labs  Lab 08/14/20 1331 08/16/20 0428  WBC 13.4* 15.0*  NEUTROABS 8.9* 9.3*  HGB 13.8 13.3  HCT 43.0 42.1  MCV 91.7 93.8  PLT 256 246   Cardiac Enzymes: No results for input(s): CKTOTAL, CKMB, CKMBINDEX, TROPONINI in the last 168 hours. BNP: Invalid input(s): POCBNP CBG: Recent Labs  Lab 08/15/20 0816 08/15/20 2145 08/16/20 0810 08/16/20 1135  GLUCAP 106* 137* 102* 97   D-Dimer Recent Labs    08/14/20 1331 08/16/20 0428  DDIMER 0.43 <0.27   Hgb A1c Recent Labs    08/16/20 0428  HGBA1C 5.7*   Lipid Profile Recent Labs    08/14/20 1331  TRIG 121   Thyroid function studies No results for input(s): TSH, T4TOTAL, T3FREE, THYROIDAB in the last 72 hours.  Invalid input(s): FREET3 Anemia work up Recent Labs    08/14/20 1331  08/16/20 0428  FERRITIN 30 25   Urinalysis No results found for: COLORURINE, APPEARANCEUR, LABSPEC, [place], GLUCOSEU, [place], [place], [place], PROTEINUR, UROBILINOGEN, NITRITE, LEUKOCYTESUR Sepsis Labs Invalid input(s): PROCALCITONIN,  WBC,  LACTICIDVEN Microbiology Recent Results (from the past 240 hour(s))  Resp Panel by RT-PCR (Flu A&B, Covid) Nasopharyngeal Swab     Status: Abnormal  Collection Time: 08/14/20 11:46 AM   Specimen: Nasopharyngeal Swab; Nasopharyngeal(NP) swabs in vial transport medium  Result Value Ref Range Status   SARS Coronavirus 2 by RT PCR POSITIVE (A) NEGATIVE Final    Comment: RESULT CALLED TO, READ BACK BY AND VERIFIED WITH: Heywood Bene RN [phone] PHILLIPS C (NOTE) SARS-CoV-2 target nucleic acids are DETECTED.  The SARS-CoV-2 RNA is generally detectable in upper respiratory specimens during the acute phase of infection. Positive results are indicative of the presence of the identified virus, but do not rule out bacterial infection or co-infection with other pathogens not detected by the test. Clinical correlation with patient history and other diagnostic information is necessary to determine patient infection status. The expected result is Negative.  Fact Sheet for Patients: BloggerCourse.com  Fact Sheet for Healthcare Providers: SeriousBroker.it  This test is not yet approved or cleared by the [place] FDA and  has been authorized for detection and/or diagnosis of SARS-CoV-2 by FDA under an Emergency Use Authorization (EUA).  This EUA will remain in effect (meaning this test can be  used) for the duration of  the COVID-19 declaration under Section 564(b)(1) of the Act, 21 U.S.C. section 360bbb-3(b)(1), unless the authorization is terminated or revoked sooner.     Influenza A by PCR NEGATIVE NEGATIVE Final   Influenza B by PCR NEGATIVE NEGATIVE Final    Comment: (NOTE) The Xpert  Xpress SARS-CoV-2/FLU/RSV plus assay is intended as an aid in the diagnosis of influenza from Nasopharyngeal swab specimens and should not be used as a sole basis for treatment. Nasal washings and aspirates are unacceptable for Xpert Xpress SARS-CoV-2/FLU/RSV testing.  Fact Sheet for Patients: BloggerCourse.com  Fact Sheet for Healthcare Providers: SeriousBroker.it  This test is not yet approved or cleared by the [place] FDA and has been authorized for detection and/or diagnosis of SARS-CoV-2 by FDA under an Emergency Use Authorization (EUA). This EUA will remain in effect (meaning this test can be used) for the duration of the COVID-19 declaration under Section 564(b)(1) of the Act, 21 U.S.C. section 360bbb-3(b)(1), unless the authorization is terminated or revoked.  Performed at [facility], [address] [place], [place] [address]   Blood Culture (routine x 2)     Status: None (Preliminary result)   Collection Time: 08/14/20  1:32 PM   Specimen: BLOOD  Result Value Ref Range Status   Specimen Description   Final    BLOOD RIGHT ARM Performed at [facility], [address] [place], [place] [address]    Special Requests   Final    BOTTLES DRAWN AEROBIC AND ANAEROBIC Blood Culture adequate volume Performed at [facility], [address] [place], [place] [address]    Culture   Final    NO GROWTH < 24 HOURS Performed at [facility], [address] [place], [place] [address]    Report Status PENDING  Incomplete     Time coordinating discharge: Over 30 minutes  SIGNED:   Azucena Fallen, DO [facility] 08/16/2020, 12:26 PM Pager   If 7PM-7AM, please contact night-coverage [email or web address]

## 2020-08-19 LAB — CULTURE, BLOOD (ROUTINE X 2)
Culture: NO GROWTH
Special Requests: ADEQUATE

## 2020-11-30 NOTE — Progress Notes (Signed)
New Patient Note  RE: Rhonda Gray MRN: [id number] DOB: 1951-01-22 Date of Office Visit: 12/01/2020  Consult requested by: Perley Jain Primary care provider: Doreen Salvage, PA-C  Chief Complaint: Covid Positive (Patient had COVID 1/1st was in hospital for 3 days @ [place].)  History of Present Illness: I had the pleasure of seeing Rhonda Gray for initial evaluation at the [facility] on 12/01/2020. She is a 70 y.o. female, who is self-referred here for the evaluation of shortness of breath.  Respiratory: She reports symptoms of chest tightness, shortness of breath, wheezing, nocturnal awakenings for 4 months since she was hospitalized for COVID-19 pneumonia. Current medications include Combivent prn which help. She reports not using aerochamber with inhalers. She tried the following inhalers: none. Main triggers are infections. In the last month, frequency of symptoms: daily. Frequency of SABA use: 0x/week. Interference with physical activity: yes. In the last 12 months, emergency room visits/urgent care visits/doctor office visits or hospitalizations due to respiratory issues: once. In the last 12 months, oral steroids courses: one. Lifetime history of hospitalization for respiratory issues: once due to COVID-19 pneumonia. Prior intubations: no. History of pneumonia: once. She was not evaluated by allergist/pulmonologist in the past. Smoking exposure: no. Up to date with flu vaccine: yes. Up to date with COVID-19 vaccine: yes. Prior Covid-19 infection: yes. History of reflux: yes and takes medications for this. No prior cardiac issues.   08/14/2020 CXR: "Low lung volumes, right base atelectasis."  08/14/2020 hospitalization: "Rhonda Gray a 70 y.o.femalewith hx of hypertension, prediabetes, obesity,whoinitially presented to outside facility on 1/1/2022secondary to cough and nasal congestion, was found to be Covid positive. Patient states for the past  week and a half she has had dry cough, facial/sinus congestion that has not improved despite over-the-counter medications, which it typically does with routine flares. She has had issues with her sinuses in the past. She has had 2 doses of Covid vaccine but did not receive her booster yet. Denies any history of asthma, smoking, or any pulmonary disease. She does not use oxygen or inhalers normally. She has mild dyspnea on exertion. Her sister recently passed away from Covid, however she says she was not by her lately. She denies any chest pain, diarrhea, fevers.  Patient admitted as above with acute pneumonia and acute hypoxia in setting of COVID-19 pneumonia.  Fortunately due to patient's Covid vaccination status she improved drastically with supportive care, she did receive Remdesivir and steroids per protocol, but given her rapid improvement she is now on room air even with exertion denies any other symptoms feels back to baseline is otherwise stable and agreeable for discharge home.  Close follow-up with PCP in 1 to 2 weeks, we discussed ongoing need for quarantine to ensure no further spread of Covid to her family, coworkers or acquaintances.."  Assessment and Plan: Rhonda Gray is a 70 y.o. female with: Reactive airway disease without complication Persistent respiratory symptoms since COVID-19 pneumonia in January 2022 requiring hospitalization.  Tried Combivent as needed with some benefit.  No prior asthma/COPD diagnosis and no other inhaler use in the past.  Today's spirometry showed some restriction with 2% improvement in FEV1 post bronchodilator treatment.  Clinically feeling improved.  HPI concerning for post infectious reactive airway disease. . Daily controller medication(s): START Symbicort 2 puffs twice a day with spacer and rinse mouth afterwards. . Demonstrated proper use. Marland Kitchen Spacer given and demonstrated proper use with inhaler. Patient understood technique and all  questions/concerned  were addressed.  . May use albuterol rescue inhaler 2 puffs every 4 to 6 hours as needed for shortness of breath, chest tightness, coughing, and wheezing. May use albuterol rescue inhaler 2 puffs 5 to 15 minutes prior to strenuous physical activities. Monitor frequency of use.  . If no improvement with above regimen then recommend follow up with PCP to rule out any cardiac issues post COVID-19 infection.   Other allergic rhinitis Perennial rhinitis symptoms and no prior allergy/ENT evaluation.    Today's skin testing showed: Positive to dust mites.  Start environmental control measures as below.  May use over the counter antihistamines such as Zyrtec (cetirizine) 10mg  daily.   May use Flonase (fluticasone) nasal spray 1 spray per nostril twice a day as needed for nasal congestion.   Nasal saline spray (i.e., Simply Saline) or nasal saline lavage (i.e., NeilMed) is recommended as needed and prior to medicated nasal sprays.  Essential hypertension Elevated blood pressure reading in the office today.  Follow-up with PCP.  Return in about 2 months (around 01/31/2021).  Meds ordered this encounter  Medications  . fluticasone (FLONASE) 50 MCG/ACT nasal spray    Sig: Place 1 spray into both nostrils 2 (two) times daily as needed (for nasal congestion).    Dispense:  16 g    Refill:  5  . cetirizine (ZYRTEC ALLERGY) 10 MG tablet    Sig: Take 1 tablet (10 mg total) by mouth daily. For allergies    Dispense:  30 tablet    Refill:  5  . budesonide-formoterol (SYMBICORT) 80-4.5 MCG/ACT inhaler    Sig: Inhale 2 puffs into the lungs in the morning and at bedtime. with spacer and rinse mouth afterwards.    Dispense:  1 each    Refill:  5  . albuterol (VENTOLIN HFA) 108 (90 Base) MCG/ACT inhaler    Sig: Inhale 2 puffs into the lungs every 4 (four) hours as needed for wheezing or shortness of breath (coughing fits).    Dispense:  18 g    Refill:  1   Lab Orders  No  laboratory test(s) ordered today    Other allergy screening: Rhino conjunctivitis:  Some rhinitis symptoms and takes no medications for this. No prior ENT evaluation.   Food allergy: no Medication allergy: no Hymenoptera allergy: no Urticaria: no Eczema:no History of recurrent infections suggestive of immunodeficency: no  Diagnostics: Spirometry:  Tracings reviewed. Her effort: Good reproducible efforts. FVC: 1.54L FEV1: 1.22L, 71% predicted FEV1/FVC ratio: 79% Interpretation: Spirometry consistent with possible restrictive disease with 2% improvement in FEV1 post bronchodilator treatment. Clinically feeling improved.   Please see scanned spirometry results for details.  Skin Testing: Environmental allergy panel. Positive test to: dust mites.  Results discussed with patient/family.  Airborne Adult Perc - 12/01/20 1433    Time Antigen Placed 1433    Allergen Manufacturer Waynette ButteryGreer    Location Back    Number of Test 59    Panel 1 Select    1. Control-Buffer 50% Glycerol Negative    2. Control-Histamine 1 mg/ml 2+    3. Albumin saline Negative    4. Bahia Negative    5. French Southern TerritoriesBermuda Negative    6. Johnson Negative    7. Kentucky Blue Negative    8. Meadow Fescue Negative    9. Perennial Rye Negative    10. Sweet Vernal Negative    11. Timothy Negative    12. Cocklebur Negative    13. Burweed Marshelder Negative  14. Ragweed, short Negative    15. Ragweed, Giant Negative    16. Plantain,  English Negative    17. Lamb's Quarters Negative    18. Sheep Sorrell Negative    19. Rough Pigweed Negative    20. Marsh Elder, Rough Negative    21. Mugwort, Common Negative    22. Ash mix Negative    23. Birch mix Negative    24. Beech American Negative    25. Box, Elder Negative    26. Cedar, red Negative    27. Cottonwood, Guinea-Bissau Negative    28. Elm mix Negative    29. Hickory Negative    30. Maple mix Negative    31. Oak, Guinea-Bissau mix Negative    32. Pecan Pollen Negative     33. Pine mix Negative    34. Sycamore Eastern Negative    35. Walnut, Black Pollen Negative    36. Alternaria alternata Negative    37. Cladosporium Herbarum Negative    38. Aspergillus mix Negative    39. Penicillium mix Negative    40. Bipolaris sorokiniana (Helminthosporium) Negative    41. Drechslera spicifera (Curvularia) Negative    42. Mucor plumbeus Negative    43. Fusarium moniliforme Negative    44. Aureobasidium pullulans (pullulara) Negative    45. Rhizopus oryzae Negative    46. Botrytis cinera Negative    47. Epicoccum nigrum Negative    48. Phoma betae Negative    49. Candida Albicans Negative    50. Trichophyton mentagrophytes Negative    51. Mite, D Farinae  5,000 AU/ml 3+    52. Mite, D Pteronyssinus  5,000 AU/ml 3+    53. Cat Hair 10,000 BAU/ml Negative    54.  Dog Epithelia Negative    55. Mixed Feathers Negative    56. Horse Epithelia Negative    57. Cockroach, German Negative    58. Mouse Negative    59. Tobacco Leaf Negative           Past Medical History: Patient Active Problem List   Diagnosis Date Noted  . Reactive airway disease without complication 12/01/2020  . Other allergic rhinitis 12/01/2020  . Essential hypertension 08/15/2020  . Prediabetes 08/15/2020  . Acute respiratory failure with hypoxia (HCC) 08/15/2020  . Pneumonia due to COVID-19 virus 08/14/2020   Past Medical History:  Diagnosis Date  . Hypertension    Past Surgical History: Past Surgical History:  Procedure Laterality Date  . ABDOMINAL HYSTERECTOMY    . CATARACT EXTRACTION, BILATERAL Bilateral   . FOOT SURGERY Left   . REPLACEMENT TOTAL KNEE Right    Medication List:  Current Outpatient Medications  Medication Sig Dispense Refill  . albuterol (VENTOLIN HFA) 108 (90 Base) MCG/ACT inhaler Inhale 2 puffs into the lungs every 4 (four) hours as needed for wheezing or shortness of breath (coughing fits). 18 g 1  . amLODipine (NORVASC) 5 MG tablet Take 5 mg by mouth  daily.    Marland Kitchen aspirin 81 MG EC tablet Take 81 mg by mouth every evening.    Marland Kitchen atorvastatin (LIPITOR) 40 MG tablet Take 40 mg by mouth daily.    . budesonide-formoterol (SYMBICORT) 80-4.5 MCG/ACT inhaler Inhale 2 puffs into the lungs in the morning and at bedtime. with spacer and rinse mouth afterwards. 1 each 5  . Calcium Citrate-Vitamin D 250-200 MG-UNIT TABS Take by mouth.    . cetirizine (ZYRTEC ALLERGY) 10 MG tablet Take 1 tablet (10 mg total) by mouth daily. For  allergies 30 tablet 5  . docusate sodium (COLACE) 100 MG capsule Take 100 mg by mouth daily.    . enalapril (VASOTEC) 10 MG tablet Take 10 mg by mouth 2 (two) times daily.    Marland Kitchen estradiol (ESTRACE) 1 MG tablet Take 1 mg by mouth daily.    . fluticasone (FLONASE) 50 MCG/ACT nasal spray Place 1 spray into both nostrils 2 (two) times daily as needed (for nasal congestion). 16 g 5  . labetalol (NORMODYNE) 200 MG tablet Take 200 mg by mouth 2 (two) times daily.    . metFORMIN (GLUCOPHAGE) 1000 MG tablet Take 1,000 mg by mouth daily.    Marland Kitchen omeprazole (PRILOSEC) 40 MG capsule Take 40 mg by mouth daily.    . pioglitazone (ACTOS) 15 MG tablet Take 15 mg by mouth daily.     No current facility-administered medications for this visit.   Allergies: Allergies  Allergen Reactions  . Pedi-Pre Tape Spray [Wound Dressing Adhesive] Rash    PAPER TAPE GIVES A RASH, PLASTIC TAPE IS FINE TO USE   Social History: Social History   Socioeconomic History  . Marital status: Married    Spouse name: Not on file  . Number of children: Not on file  . Years of education: Not on file  . Highest education level: Not on file  Occupational History  . Not on file  Tobacco Use  . Smoking status: Never Smoker  . Smokeless tobacco: Never Used  Vaping Use  . Vaping Use: Never used  Substance and Sexual Activity  . Alcohol use: Never  . Drug use: Never  . Sexual activity: Not on file  Other Topics Concern  . Not on file  Social History Narrative  . Not  on file   Social Determinants of Health   Financial Resource Strain: Not on file  Food Insecurity: Not on file  Transportation Needs: Not on file  Physical Activity: Not on file  Stress: Not on file  Social Connections: Not on file   Lives in a 68 year old house. Smoking: denies Occupation: Comptroller HistorySurveyor, minerals in the house: no Engineer, civil (consulting) in the family room: no Carpet in the bedroom: no Heating: oil Cooling: window Pet: no  Family History: Family History  Problem Relation Age of Onset  . Asthma Mother   . Asthma Son   . Asthma Grandson   . Allergic rhinitis Neg Hx   . Angioedema Neg Hx   . Atopy Neg Hx   . Eczema Neg Hx   . Immunodeficiency Neg Hx   . Urticaria Neg Hx     Review of Systems  Constitutional: Negative for appetite change, chills, fever and unexpected weight change.  HENT: Positive for congestion. Negative for rhinorrhea.   Eyes: Negative for itching.  Respiratory: Positive for chest tightness and shortness of breath. Negative for cough and wheezing.   Cardiovascular: Negative for chest pain.  Gastrointestinal: Negative for abdominal pain.  Genitourinary: Negative for difficulty urinating.  Skin: Negative for rash.  Allergic/Immunologic: Positive for environmental allergies.  Neurological: Negative for headaches.   Objective: BP (!) 180/82 (Patient Position: Sitting)   Pulse 88   Temp 98.2 F (36.8 C) (Temporal)   Ht 4' 11.5" (1.511 m)   Wt 198 lb (89.8 kg)   SpO2 97%   BMI 39.32 kg/m  Body mass index is 39.32 kg/m. Physical Exam Vitals and nursing note reviewed.  Constitutional:      Appearance: Normal appearance. She is well-developed.  HENT:     Head: Normocephalic and atraumatic.     Right Ear: Tympanic membrane and external ear normal.     Left Ear: Tympanic membrane and external ear normal.     Nose: Nose normal.     Mouth/Throat:     Mouth: Mucous membranes are moist.     Pharynx: Oropharynx is  clear.  Eyes:     Conjunctiva/sclera: Conjunctivae normal.  Cardiovascular:     Rate and Rhythm: Normal rate and regular rhythm.     Heart sounds: Normal heart sounds. No murmur heard. No friction rub. No gallop.   Pulmonary:     Effort: Pulmonary effort is normal.     Breath sounds: Normal breath sounds. No wheezing, rhonchi or rales.  Musculoskeletal:     Cervical back: Neck supple.  Skin:    General: Skin is warm.     Findings: No rash.  Neurological:     Mental Status: She is alert and oriented to person, place, and time.  Psychiatric:        Behavior: Behavior normal.    The plan was reviewed with the patient/family, and all questions/concerned were addressed.  It was my pleasure to see Rhonda Gray today and participate in her care. Please feel free to contact me with any questions or concerns.  Sincerely,  Wyline Mood, DO Allergy & Immunology  [facility] office: [phone] [facility] office: [phone]

## 2020-12-01 ENCOUNTER — Encounter: Payer: Self-pay | Admitting: Allergy

## 2020-12-01 ENCOUNTER — Other Ambulatory Visit: Payer: Self-pay

## 2020-12-01 ENCOUNTER — Ambulatory Visit: Payer: Medicare Other | Admitting: Allergy

## 2020-12-01 VITALS — BP 180/82 | HR 88 | Temp 98.2°F | Ht 59.5 in | Wt 198.0 lb

## 2020-12-01 DIAGNOSIS — I10 Essential (primary) hypertension: Secondary | ICD-10-CM

## 2020-12-01 DIAGNOSIS — J45909 Unspecified asthma, uncomplicated: Secondary | ICD-10-CM

## 2020-12-01 DIAGNOSIS — J3089 Other allergic rhinitis: Secondary | ICD-10-CM | POA: Diagnosis not present

## 2020-12-01 DIAGNOSIS — J4551 Severe persistent asthma with (acute) exacerbation: Secondary | ICD-10-CM | POA: Diagnosis not present

## 2020-12-01 MED ORDER — CETIRIZINE HCL 10 MG PO TABS: 10.0000 mg | ORAL_TABLET | Freq: Every day | ORAL | 5 refills | Status: DC

## 2020-12-01 MED ORDER — BUDESONIDE-FORMOTEROL FUMARATE 80-4.5 MCG/ACT IN AERO: 2.0000 | INHALATION_SPRAY | Freq: Two times a day (BID) | RESPIRATORY_TRACT | 5 refills | Status: AC

## 2020-12-01 MED ORDER — ALBUTEROL SULFATE HFA 108 (90 BASE) MCG/ACT IN AERS: 2.0000 | INHALATION_SPRAY | RESPIRATORY_TRACT | 1 refills | Status: AC | PRN

## 2020-12-01 MED ORDER — FLUTICASONE PROPIONATE 50 MCG/ACT NA SUSP: 1.0000 | Freq: Two times a day (BID) | NASAL | 5 refills | Status: AC | PRN

## 2020-12-01 NOTE — Assessment & Plan Note (Signed)
Persistent respiratory symptoms since COVID-19 pneumonia in January 2022 requiring hospitalization.  Tried Combivent as needed with some benefit.  No prior asthma/COPD diagnosis and no other inhaler use in the past.  Today's spirometry showed some restriction with 2% improvement in FEV1 post bronchodilator treatment.  Clinically feeling improved.  HPI concerning for post infectious reactive airway disease. . Daily controller medication(s): START Symbicort 2 puffs twice a day with spacer and rinse mouth afterwards. . Demonstrated proper use. Marland Kitchen Spacer given and demonstrated proper use with inhaler. Patient understood technique and all questions/concerned were addressed.  . May use albuterol rescue inhaler 2 puffs every 4 to 6 hours as needed for shortness of breath, chest tightness, coughing, and wheezing. May use albuterol rescue inhaler 2 puffs 5 to 15 minutes prior to strenuous physical activities. Monitor frequency of use.  . If no improvement with above regimen then recommend follow up with PCP to rule out any cardiac issues post COVID-19 infection.

## 2020-12-01 NOTE — Assessment & Plan Note (Signed)
Elevated blood pressure reading in the office today.  Follow-up with PCP.

## 2020-12-01 NOTE — Assessment & Plan Note (Signed)
Perennial rhinitis symptoms and no prior allergy/ENT evaluation.    Today's skin testing showed: Positive to dust mites.  Start environmental control measures as below.  May use over the counter antihistamines such as Zyrtec (cetirizine) 10mg  daily.   May use Flonase (fluticasone) nasal spray 1 spray per nostril twice a day as needed for nasal congestion.   Nasal saline spray (i.e., Simply Saline) or nasal saline lavage (i.e., NeilMed) is recommended as needed and prior to medicated nasal sprays.

## 2020-12-01 NOTE — Patient Instructions (Addendum)
Today's skin testing showed: Positive to dust mites. Results given.  Breathing: . Daily controller medication(s): START Symbicort 2 puffs twice a day with spacer and rinse mouth afterwards. . Demonstrated proper use. Marland Kitchen Spacer given and demonstrated proper use with inhaler. Patient understood technique and all questions/concerned were addressed.   . May use albuterol rescue inhaler 2 puffs every 4 to 6 hours as needed for shortness of breath, chest tightness, coughing, and wheezing. May use albuterol rescue inhaler 2 puffs 5 to 15 minutes prior to strenuous physical activities. Monitor frequency of use.  . Breathing control goals:  o Full participation in all desired activities (may need albuterol before activity) o Albuterol use two times or less a week on average (not counting use with activity) o Cough interfering with sleep two times or less a month o Oral steroids no more than once a year o No hospitalizations  Environmental allergies  Start environmental control measures as below.  May use over the counter antihistamines such as Zyrtec (cetirizine) 10mg  daily.   May use Flonase (fluticasone) nasal spray 1 spray per nostril twice a day as needed for nasal congestion.   Nasal saline spray (i.e., Simply Saline) or nasal saline lavage (i.e., NeilMed) is recommended as needed and prior to medicated nasal sprays.  Follow up in 2 months or sooner if needed.   If shortness of breath is not improving - recommend follow up with PCP to rule out any heart issues.   Control of House Dust Mite Allergen . Dust mite allergens are a common trigger of allergy and asthma symptoms. While they can be found throughout the house, these microscopic creatures thrive in warm, humid environments such as bedding, upholstered furniture and carpeting. . Because so much time is spent in the bedroom, it is essential to reduce mite levels there.  . Encase pillows, mattresses, and box springs in special  allergen-proof fabric covers or airtight, zippered plastic covers.  . Bedding should be washed weekly in hot water (130 F) and dried in a hot dryer. Allergen-proof covers are available for comforters and pillows that can't be regularly washed.  the allergy-proof covers every few months. Minimize clutter in the bedroom. Keep pets out of the bedroom.  Reyes Ivan Keep humidity less than 50% by using a dehumidifier or air conditioning. You can buy a humidity measuring device called a hygrometer to monitor this.  . If possible, replace carpets with hardwood, linoleum, or washable area rugs. If that's not possible, vacuum frequently with a vacuum that has a HEPA filter. . Remove all upholstered furniture and non-washable window drapes from the bedroom. . Remove all non-washable stuffed toys from the bedroom.  Wash stuffed toys weekly.

## 2021-01-03 ENCOUNTER — Telehealth: Payer: Self-pay | Admitting: Allergy

## 2021-01-03 NOTE — Telephone Encounter (Signed)
Pt request a cheaper version of Symbicort, pt states it's too expensive.

## 2021-01-03 NOTE — Telephone Encounter (Signed)
Spoke with the patient and the pharmacy. Insurance will cover the Symbicort for $47. Order of Albuterol was also confirm and both Rx will be ready for pick up. Patient was notify and understanding was verbalized.

## 2021-02-01 ENCOUNTER — Ambulatory Visit: Payer: Medicare Other | Admitting: Family Medicine

## 2021-02-01 ENCOUNTER — Other Ambulatory Visit: Payer: Self-pay

## 2021-02-01 ENCOUNTER — Encounter: Payer: Self-pay | Admitting: Family Medicine

## 2021-02-01 VITALS — BP 170/86 | HR 72 | Temp 97.5°F | Resp 20

## 2021-02-01 DIAGNOSIS — I10 Essential (primary) hypertension: Secondary | ICD-10-CM

## 2021-02-01 DIAGNOSIS — J45909 Unspecified asthma, uncomplicated: Secondary | ICD-10-CM | POA: Diagnosis not present

## 2021-02-01 DIAGNOSIS — J3089 Other allergic rhinitis: Secondary | ICD-10-CM

## 2021-02-01 MED ORDER — MONTELUKAST SODIUM 10 MG PO TABS: 10.0000 mg | ORAL_TABLET | Freq: Every day | ORAL | 6 refills | Status: AC

## 2021-02-01 NOTE — Progress Notes (Signed)
[address] [place] [address] Dept: [phone]  FOLLOW UP NOTE  Patient ID: Rhonda Gray, female    DOB: December 10, 1950  Age: 70 y.o. MRN: [id number] Date of Office Visit: 02/01/2021  Assessment  Chief Complaint: Allergic Rhinitis  (a) and Asthma  HPI Rhonda Gray is a 70 year old female who presents to the clinic for follow-up visit.  She was last seen in this clinic on 12/01/2020 by Dr. Selena Batten for evaluation of asthma post COVID, allergic rhinitis, and hypertension.  At today's visit, she reports her asthma has been moderately well controlled with shortness of breath occurring with activity.  She denies wheeze and cough with activity and rest.  She denies overnight symptoms.  She denies shortness of breath with rest.  She continues Symbicort 80-2 puffs twice a day and has not used her albuterol since her last visit to this clinic.  She does report that she dislikes any inhaled medications.  Allergic rhinitis is reported as moderately well controlled with symptoms including occasional nasal congestion and postnasal drainage.  She continues cetirizine 10 mg once a day and uses Flonase as needed.  She is not currently using nasal saline rinses.  Reflux is reported as well controlled with no complaint of heartburn or vomiting.  She continues omeprazole 40 mg once a day.  Her current medications are listed in the chart. Of note, return demonstration of inhaler use indicates incorrect technique.  Drug Allergies:  Allergies  Allergen Reactions   Pedi-Pre Tape Spray [Wound Dressing Adhesive] Rash    PAPER TAPE GIVES A RASH, PLASTIC TAPE IS FINE TO USE    Physical Exam: BP (!) 170/86 (BP Location: Right Arm, Patient Position: Sitting, Cuff Size: Normal)   Pulse 72   Temp (!) 97.5 F (36.4 C) (Tympanic)   Resp 20   SpO2 99%    Physical Exam Vitals reviewed.  Constitutional:      Appearance: Normal appearance.  HENT:     Head: Normocephalic and atraumatic.     Right Ear:  Tympanic membrane normal.     Left Ear: Tympanic membrane normal.     Nose:     Comments: Bilateral nares edematous and pale with clear nasal drainage noted.  Pharynx normal.  Ears normal.  Eyes normal.    Mouth/Throat:     Pharynx: Oropharynx is clear.  Eyes:     Conjunctiva/sclera: Conjunctivae normal.  Cardiovascular:     Rate and Rhythm: Normal rate and regular rhythm.     Heart sounds: No murmur heard. Pulmonary:     Effort: Pulmonary effort is normal.     Breath sounds: Normal breath sounds.     Comments: Lungs clear to auscultation Musculoskeletal:        General: Normal range of motion.     Cervical back: Normal range of motion and neck supple.  Skin:    General: Skin is warm and dry.  Neurological:     Mental Status: She is alert and oriented to person, place, and time.  Psychiatric:        Mood and Affect: Mood normal.        Behavior: Behavior normal.        Thought Content: Thought content normal.        Judgment: Judgment normal.    Diagnostics: FVC 1.55, FEV1 1.21. Predicted FVC 2.16, predicted FEV1 1.71.  Spirometry indicates normal ventilatory function.  Assessment and Plan: 1. Reactive airway disease without complication, unspecified asthma severity, unspecified whether persistent  2. Essential hypertension   3. Perennial allergic rhinitis     Meds ordered this encounter  Medications   montelukast (SINGULAIR) 10 MG tablet    Sig: Take 1 tablet (10 mg total) by mouth at bedtime.    Dispense:  31 tablet    Refill:  6     Patient Instructions  Asthma Begin montelukast 10 mg once a day Continue Symbicort 80-2 puffs twice a day with a spacer to prevent cough or wheeze Continue albuterol 2 puffs once every 4 hours as needed for cough or wheeze You may use albuterol 2 puffs 5 to 15 minutes before activity to decrease cough or wheeze Move forward with a cardiac workup with your primary care provider  Allergic rhinitis Continue allergen avoidance  measures directed toward dust mite as listed below Continue cetirizine 10 mg once a day as needed for runny nose or itch.Remember to rotate to a different antihistamine about every 3 months. Some examples of over the counter antihistamines include Zyrtec (cetirizine), Xyzal (levocetirizine), Allegra (fexofenadine), and Claritin (loratidine).  Continue Flonase 1 to 2 sprays in each nostril once a day as needed for stuffy nose. In the right nostril, point the applicator out toward the right ear. In the left nostril, point the applicator out toward the left ear Consider saline nasal rinses as needed for nasal symptoms. Use this before any medicated nasal sprays for best result  Reflux Continue dietary lifestyle modifications as listed below Continue omeprazole as previously prescribed  Your blood pressure was elevated at today's visit. Follow up with your primary care provider about your blood pressure  Call the clinic if this treatment plan is not working well for you.  Follow up in 2 weeks or sooner if needed.   Return in about 2 weeks (around 02/15/2021), or if symptoms worsen or fail to improve.    Thank you for the opportunity to care for this patient.  Please do not hesitate to contact me with questions.  Thermon Leyland, FNP [facility]

## 2021-02-01 NOTE — Patient Instructions (Addendum)
Asthma Begin montelukast 10 mg once a day Continue Symbicort 80-2 puffs twice a day with a spacer to prevent cough or wheeze Continue albuterol 2 puffs once every 4 hours as needed for cough or wheeze You may use albuterol 2 puffs 5 to 15 minutes before activity to decrease cough or wheeze Move forward with a cardiac workup with your primary care provider  Allergic rhinitis Continue allergen avoidance measures directed toward dust mite as listed below Continue cetirizine 10 mg once a day as needed for runny nose or itch.Remember to rotate to a different antihistamine about every 3 months. Some examples of over the counter antihistamines include Zyrtec (cetirizine), Xyzal (levocetirizine), Allegra (fexofenadine), and Claritin (loratidine).  Continue Flonase 1 to 2 sprays in each nostril once a day as needed for stuffy nose. In the right nostril, point the applicator out toward the right ear. In the left nostril, point the applicator out toward the left ear Consider saline nasal rinses as needed for nasal symptoms. Use this before any medicated nasal sprays for best result  Reflux Continue dietary lifestyle modifications as listed below Continue omeprazole as previously prescribed  Your blood pressure was elevated at today's visit. Follow up with your primary care provider about your blood pressure  Call the clinic if this treatment plan is not working well for you.  Follow up in 2 weeks or sooner if needed.   Lifestyle Changes for Controlling GERD When you have GERD, stomach acid feels as if it's backing up toward your mouth. Whether or not you take medication to control your GERD, your symptoms can often be improved with lifestyle changes.   Raise Your Head Reflux is more likely to strike when you're lying down flat, because stomach fluid can flow backward more easily. Raising the head of your bed 4-6 inches can help. To do this: Slide blocks or books under the legs at the head of  your bed. Or, place a wedge under the mattress. Many foam stores can make a suitable wedge for you. The wedge should run from your waist to the top of your head. Don't just prop your head on several pillows. This increases pressure on your stomach. It can make GERD worse.  Watch Your Eating Habits Certain foods may increase the acid in your stomach or relax the lower esophageal sphincter, making GERD more likely. It's best to avoid the following: Coffee, tea, and carbonated drinks (with and without caffeine) Fatty, fried, or spicy food Mint, chocolate, onions, and tomatoes Any other foods that seem to irritate your stomach or cause you pain  Relieve the Pressure Eat smaller meals, even if you have to eat more often. Don't lie down right after you eat. Wait a few hours for your stomach to empty. Avoid tight belts and tight-fitting clothes. Lose excess weight.  Tobacco and Alcohol Avoid smoking tobacco and drinking alcohol. They can make GERD symptoms worse.  Control of Dust Mite Allergen Dust mites play a major role in allergic asthma and rhinitis. They occur in environments with high humidity wherever human skin is found. Dust mites absorb humidity from the atmosphere (ie, they do not drink) and feed on organic matter (including shed human and animal skin). Dust mites are a microscopic type of insect that you cannot see with the naked eye. High levels of dust mites have been detected from mattresses, pillows, carpets, upholstered furniture, bed covers, clothes, soft toys and any woven material. The principal allergen of the dust mite is found in its  feces. A gram of dust may contain 1,000 mites and 250,000 fecal particles. Mite antigen is easily measured in the air during house cleaning activities. Dust mites do not bite and do not cause harm to humans, other than by triggering allergies/asthma.  Ways to decrease your exposure to dust mites in your home:  1. Encase mattresses, box springs  and pillows with a mite-impermeable barrier or cover  2. Wash sheets, blankets and drapes weekly in hot water (130 F) with detergent and dry them in a dryer on the hot setting.  3. Have the room cleaned frequently with a vacuum cleaner and a damp dust-mop. For carpeting or rugs, vacuuming with a vacuum cleaner equipped with a high-efficiency particulate air (HEPA) filter. The dust mite allergic individual should not be in a room which is being cleaned and should wait 1 hour after cleaning before going into the room.  4. Do not sleep on upholstered furniture (eg, couches).  5. If possible removing carpeting, upholstered furniture and drapery from the home is ideal. Horizontal blinds should be eliminated in the rooms where the person spends the most time (bedroom, study, television room). Washable vinyl, roller-type shades are optimal.  6. Remove all non-washable stuffed toys from the bedroom. Wash stuffed toys weekly like sheets and blankets above.  7. Reduce indoor humidity to less than 50%. Inexpensive humidity monitors can be purchased at most hardware stores. Do not use a humidifier as can make the problem worse and are not recommended.

## 2021-03-14 ENCOUNTER — Encounter: Payer: Self-pay | Admitting: Family Medicine

## 2021-03-14 ENCOUNTER — Ambulatory Visit: Payer: Medicare Other | Admitting: Family Medicine

## 2021-03-14 ENCOUNTER — Other Ambulatory Visit: Payer: Self-pay

## 2021-03-14 VITALS — BP 158/70 | HR 88 | Temp 98.1°F | Resp 20

## 2021-03-14 DIAGNOSIS — J4540 Moderate persistent asthma, uncomplicated: Secondary | ICD-10-CM

## 2021-03-14 DIAGNOSIS — I10 Essential (primary) hypertension: Secondary | ICD-10-CM

## 2021-03-14 DIAGNOSIS — J3089 Other allergic rhinitis: Secondary | ICD-10-CM

## 2021-03-14 DIAGNOSIS — K219 Gastro-esophageal reflux disease without esophagitis: Secondary | ICD-10-CM

## 2021-03-14 MED ORDER — FAMOTIDINE 40 MG PO TABS: ORAL_TABLET | ORAL | 5 refills | Status: DC

## 2021-03-14 NOTE — Progress Notes (Addendum)
[address] [place] [address] Dept: [phone]  FOLLOW UP NOTE  Patient ID: Rhonda Gray, female    DOB: [birth year]  Age: 70 y.o. MRN: [id number] Date of Office Visit: 03/14/2021  Assessment  Chief Complaint: Follow-up and Asthma (Asthma symptoms have improved a little but still having occasional flare up during physical activities and hot weathers.)  HPI Rhonda Gray is a 70 year old female who presents the clinic for follow-up visit.  She was last seen in this clinic on 02/01/2021 for evaluation of asthma, allergic rhinitis, and reflux.  At her last visit, she started montelukast in addition to her previous asthma maintenance and rescue regimen.  At today's visit, she reports her asthma has been moderately well controlled with shortness of breath occurring after she stops any physical activity.  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  She denies cough.  She denies a new living situation, new carpeting or draperies, or new air fresheners.  She continues montelukast occasionally, Symbicort 80 at night about 3 days a week when she is wheezing, and uses her albuterol about once a week.  She reports that she has recently visited her cardiologist and had a twelve-lead EKG as well as echocardiogram that were within normal limits.  She has recently started taking triamtrene. She does report occasional ankle swelling occurring in the evening which resolves overnight.  She reports allergic rhinitis is moderately well controlled with occasional nasal congestion and postnasal drainage for which she continues cetirizine 10 mg once a day and uses Flonase as needed.  Reflux is reported as well controlled with no heartburn or vomiting.  She continues omeprazole 40 mg once a day before her first meal of the day.  Her current medications are listed in  the chart.  Of note, records have been requested for review.    Allergies  Allergen Reactions   Pedi-Pre Tape Spray [Wound Dressing Adhesive] Rash    PAPER TAPE GIVES A RASH, PLASTIC TAPE IS FINE TO USE    Physical Exam: BP (!) 158/70 (BP Location: Right Arm, Patient Position: Sitting, Cuff Size: Normal)   Pulse 88   Temp 98.1 F (36.7 C) (Temporal)   Resp 20   SpO2 99%    Physical Exam Vitals reviewed.  Constitutional:      Appearance: Normal appearance.  HENT:     Head: Normocephalic and atraumatic.     Right Ear: Tympanic membrane normal.     Left Ear: Tympanic membrane normal.     Nose:     Comments: Bilateral nares normal.  Pharynx normal.  Ears normal.  Eyes normal.    Mouth/Throat:     Pharynx: Oropharynx is clear.  Eyes:     Conjunctiva/sclera: Conjunctivae normal.  Cardiovascular:     Rate and Rhythm: Normal rate and regular rhythm.     Heart sounds: Normal heart sounds. No murmur heard. Pulmonary:     Effort: Pulmonary effort is normal.     Breath sounds: Normal breath sounds.     Comments: Lungs clear to auscultation Musculoskeletal:        General: Normal range of motion.     Cervical back: Normal range of motion and neck supple.  Skin:    General: Skin is warm and dry.  Neurological:     Mental Status: She is alert and oriented  to person, place, and time.  Psychiatric:        Mood and Affect: Mood normal.        Behavior: Behavior normal.        Thought Content: Thought content normal.        Judgment: Judgment normal.    Diagnostics: FVC 1.74, FEV1 1.38.  Predicted FVC 2.08, predicted FEV1 1.65.  Spirometry indicates normal ventilatory function.  Assessment and Plan: 1. Moderate persistent reactive airway disease without complication   2. Perennial allergic rhinitis   3. Gastroesophageal reflux disease, unspecified whether esophagitis present   4. Essential hypertension     Meds ordered this encounter  Medications   famotidine (PEPCID) 40  MG tablet    Sig: Take 1 tab in the evening for reflux control.    Dispense:  30 tablet    Refill:  5     Patient Instructions  Asthma Restart montelukast 10 mg once a day Begin AirDuo 113-1 puff twice a day to prevent cough or wheeze. This will replace Symbicort  Continue albuterol 2 puffs once every 4 hours as needed for cough or wheeze You may use albuterol 2 puffs 5 to 15 minutes before activity to decrease cough or wheeze  Allergic rhinitis Continue allergen avoidance measures directed toward dust mite as listed below Continue cetirizine 10 mg once a day as needed for runny nose or itch. Remember to rotate to a different antihistamine about every 3 months. Some examples of over the counter antihistamines include Zyrtec (cetirizine), Xyzal (levocetirizine), Allegra (fexofenadine), and Claritin (loratidine).  Continue Flonase 1 to 2 sprays in each nostril once a day as needed for stuffy nose. In the right nostril, point the applicator out toward the right ear. In the left nostril, point the applicator out toward the left ear Consider saline nasal rinses as needed for nasal symptoms. Use this before any medicated nasal sprays for best result  Reflux Begin Famotidine 40 mg once in the evening to control reflux Continue omeprazole 40 mg once a day as previously prescribed Continue dietary lifestyle modifications as listed below   Your blood pressure was elevated at today's visit. Follow up with your primary care provider about your blood pressure  Call the clinic if this treatment plan is not working well for you.  Follow up in 2 months or sooner if needed.   Return in about 2 months (around 05/14/2021), or if symptoms worsen or fail to improve.    Thank you for the opportunity to care for this patient.  Please do not hesitate to contact me with questions.  Thermon Leyland, FNP [facility]

## 2021-03-14 NOTE — Patient Instructions (Addendum)
Asthma Restart montelukast 10 mg once a day Begin AirDuo 113-1 puff twice a day to prevent cough or wheeze. This will replace Symbicort  Continue albuterol 2 puffs once every 4 hours as needed for cough or wheeze You may use albuterol 2 puffs 5 to 15 minutes before activity to decrease cough or wheeze  Allergic rhinitis Continue allergen avoidance measures directed toward dust mite as listed below Continue cetirizine 10 mg once a day as needed for runny nose or itch. Remember to rotate to a different antihistamine about every 3 months. Some examples of over the counter antihistamines include Zyrtec (cetirizine), Xyzal (levocetirizine), Allegra (fexofenadine), and Claritin (loratidine).  Continue Flonase 1 to 2 sprays in each nostril once a day as needed for stuffy nose. In the right nostril, point the applicator out toward the right ear. In the left nostril, point the applicator out toward the left ear Consider saline nasal rinses as needed for nasal symptoms. Use this before any medicated nasal sprays for best result  Reflux Begin Famotidine 40 mg once in the evening to control reflux Continue omeprazole 40 mg once a day as previously prescribed Continue dietary lifestyle modifications as listed below   Your blood pressure was elevated at today's visit. Follow up with your primary care provider about your blood pressure  Call the clinic if this treatment plan is not working well for you.  Follow up in 2 months or sooner if needed.   Lifestyle Changes for Controlling GERD When you have GERD, stomach acid feels as if it's backing up toward your mouth. Whether or not you take medication to control your GERD, your symptoms can often be improved with lifestyle changes.   Raise Your Head Reflux is more likely to strike when you're lying down flat, because stomach fluid can flow backward more easily. Raising the head of your bed 4-6 inches can help. To do this: Slide blocks or books under  the legs at the head of your bed. Or, place a wedge under the mattress. Many foam stores can make a suitable wedge for you. The wedge should run from your waist to the top of your head. Don't just prop your head on several pillows. This increases pressure on your stomach. It can make GERD worse.  Watch Your Eating Habits Certain foods may increase the acid in your stomach or relax the lower esophageal sphincter, making GERD more likely. It's best to avoid the following: Coffee, tea, and carbonated drinks (with and without caffeine) Fatty, fried, or spicy food Mint, chocolate, onions, and tomatoes Any other foods that seem to irritate your stomach or cause you pain  Relieve the Pressure Eat smaller meals, even if you have to eat more often. Don't lie down right after you eat. Wait a few hours for your stomach to empty. Avoid tight belts and tight-fitting clothes. Lose excess weight.  Tobacco and Alcohol Avoid smoking tobacco and drinking alcohol. They can make GERD symptoms worse.  Control of Dust Mite Allergen Dust mites play a major role in allergic asthma and rhinitis. They occur in environments with high humidity wherever human skin is found. Dust mites absorb humidity from the atmosphere (ie, they do not drink) and feed on organic matter (including shed human and animal skin). Dust mites are a microscopic type of insect that you cannot see with the naked eye. High levels of dust mites have been detected from mattresses, pillows, carpets, upholstered furniture, bed covers, clothes, soft toys and any woven material. The principal  allergen of the dust mite is found in its feces. A gram of dust may contain 1,000 mites and 250,000 fecal particles. Mite antigen is easily measured in the air during house cleaning activities. Dust mites do not bite and do not cause harm to humans, other than by triggering allergies/asthma.  Ways to decrease your exposure to dust mites in your home:  1. Encase  mattresses, box springs and pillows with a mite-impermeable barrier or cover  2. Wash sheets, blankets and drapes weekly in hot water (130 F) with detergent and dry them in a dryer on the hot setting.  3. Have the room cleaned frequently with a vacuum cleaner and a damp dust-mop. For carpeting or rugs, vacuuming with a vacuum cleaner equipped with a high-efficiency particulate air (HEPA) filter. The dust mite allergic individual should not be in a room which is being cleaned and should wait 1 hour after cleaning before going into the room.  4. Do not sleep on upholstered furniture (eg, couches).  5. If possible removing carpeting, upholstered furniture and drapery from the home is ideal. Horizontal blinds should be eliminated in the rooms where the person spends the most time (bedroom, study, television room). Washable vinyl, roller-type shades are optimal.  6. Remove all non-washable stuffed toys from the bedroom. Wash stuffed toys weekly like sheets and blankets above.  7. Reduce indoor humidity to less than 50%. Inexpensive humidity monitors can be purchased at most hardware stores. Do not use a humidifier as can make the problem worse and are not recommended.

## 2021-05-19 ENCOUNTER — Ambulatory Visit: Payer: Medicare Other | Admitting: Family

## 2021-05-24 ENCOUNTER — Ambulatory Visit: Payer: Medicare Other | Admitting: Family

## 2021-06-07 ENCOUNTER — Other Ambulatory Visit: Payer: Self-pay | Admitting: Allergy

## 2021-09-27 ENCOUNTER — Other Ambulatory Visit: Payer: Self-pay | Admitting: Family Medicine

## 2021-12-08 IMAGING — DX DG CHEST 1V PORT
1 series · 1 of 1 positions shown · non-contrast
Comparison: None.

CLINICAL DATA: Cough, congestion

EXAM:
PORTABLE CHEST 1 VIEW

[chest ap]
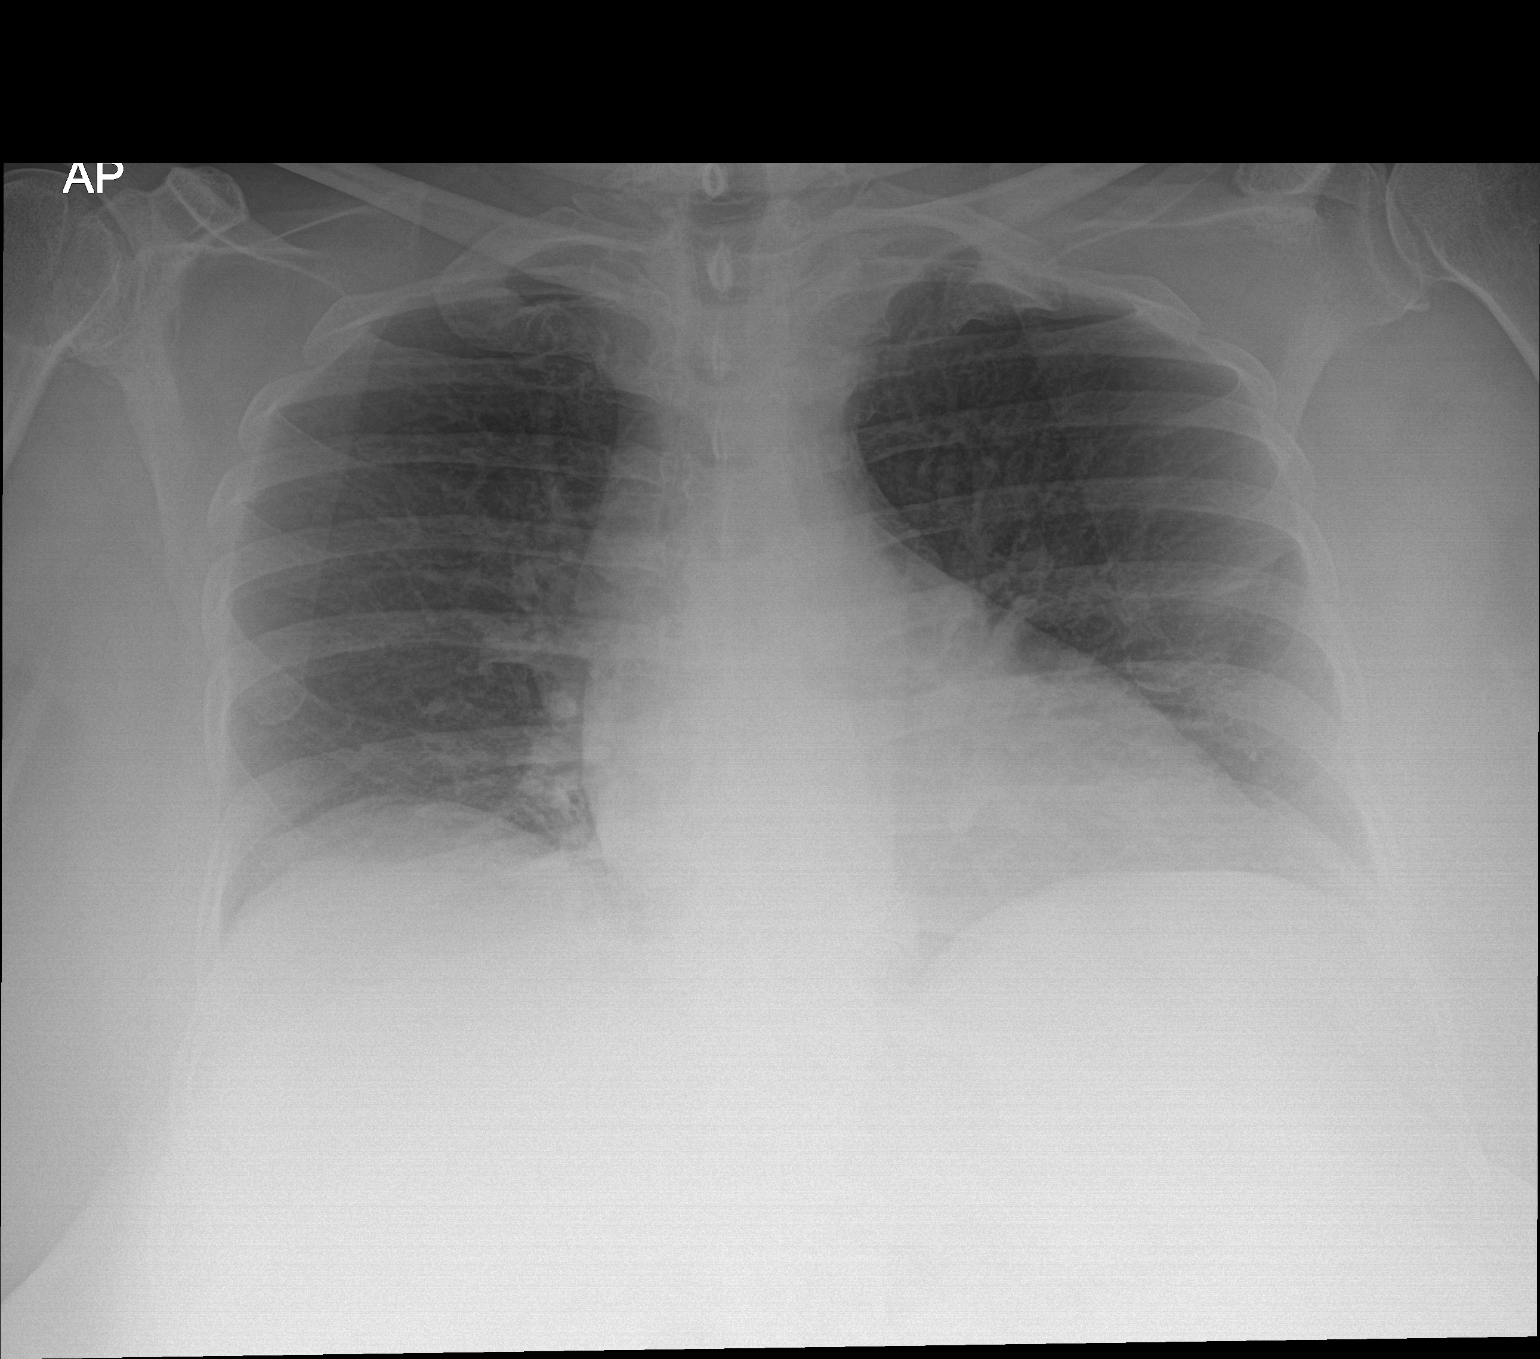

[1 of 1 positions shown; findings below may reference images not displayed]

FINDINGS: Low lung volumes. Right base atelectasis. Left lung clear. Heart is
normal size. No effusions or acute bony abnormality.
IMPRESSION: Low lung volumes, right base atelectasis.

## 2023-06-24 ENCOUNTER — Emergency Department (HOSPITAL_BASED_OUTPATIENT_CLINIC_OR_DEPARTMENT_OTHER): Payer: Medicare Other

## 2023-06-24 ENCOUNTER — Emergency Department (HOSPITAL_BASED_OUTPATIENT_CLINIC_OR_DEPARTMENT_OTHER)
Admission: EM | Admit: 2023-06-24 | Discharge: 2023-06-24 | Disposition: A | Discharge status: Home / Self Care | Payer: Medicare Other | Attending: Emergency Medicine | Admitting: Emergency Medicine

## 2023-06-24 ENCOUNTER — Other Ambulatory Visit: Payer: Self-pay

## 2023-06-24 ENCOUNTER — Encounter (HOSPITAL_BASED_OUTPATIENT_CLINIC_OR_DEPARTMENT_OTHER): Payer: Self-pay | Admitting: Emergency Medicine

## 2023-06-24 DIAGNOSIS — Z79899 Other long term (current) drug therapy: Secondary | ICD-10-CM | POA: Diagnosis not present

## 2023-06-24 DIAGNOSIS — M25561 Pain in right knee: Secondary | ICD-10-CM | POA: Insufficient documentation

## 2023-06-24 DIAGNOSIS — I10 Essential (primary) hypertension: Secondary | ICD-10-CM | POA: Diagnosis not present

## 2023-06-24 DIAGNOSIS — Z7982 Long term (current) use of aspirin: Secondary | ICD-10-CM | POA: Diagnosis not present

## 2023-06-24 MED ORDER — HYDROCODONE-ACETAMINOPHEN 5-325 MG PO TABS
1.0000 | ORAL_TABLET | Freq: Once | ORAL | Status: AC
  Administered 2023-06-24: 1 via ORAL
  Filled 2023-06-24: qty 1

## 2023-06-24 MED ORDER — HYDROCODONE-ACETAMINOPHEN 5-325 MG PO TABS: 1.0000 | ORAL_TABLET | Freq: Four times a day (QID) | ORAL | 0 refills | Status: DC | PRN

## 2023-06-24 NOTE — ED Provider Notes (Addendum)
[place] [facility] Provider Note   CSN: [id number] Arrival date & time: 06/24/23  4401     History  Chief Complaint  Patient presents with   Knee Pain    Rhonda Gray is a 72 y.o. female.  Patient status post right knee replacement by Dr. Samuel Bouche at [facility] sports medicine approximately 10 years ago.  Patient in February required an injection in that knee due to pain.  So she has been having some trouble with pain.  Starting couple days ago started having pain again in the knee.  She does have a follow-up appointment regarding this on November 21.  She also felt a pop.  But there is been no injury or fall.  Pain around the right knee no swelling does radiate up into the thigh area.  No numbness or weakness to the foot.  No shortness of breath no chest pain.  Patient states pain definitely originating from the right knee area.  Past medical history significant for hypertension past surgical history significant for abdominal hysterectomy and total knee replacement on the right foot surgery on the left.  Patient does not use tobacco products.       Home Medications Prior to Admission medications   Medication Sig Start Date End Date Taking? Authorizing Provider  albuterol (VENTOLIN HFA) 108 (90 Base) MCG/ACT inhaler Inhale 2 puffs into the lungs every 4 (four) hours as needed for wheezing or shortness of breath (coughing fits). 12/01/20   Ellamae Sia, DO  amLODipine (NORVASC) 10 MG tablet Take 1 tablet by mouth every evening. 08/25/16   [provider]  aspirin 81 MG EC tablet Take 81 mg by mouth every evening.    [provider]  atorvastatin (LIPITOR) 40 MG tablet  09/06/16   [provider]  budesonide-formoterol (SYMBICORT) 80-4.5 MCG/ACT inhaler Inhale 2 puffs into the lungs in the morning and at bedtime. with spacer and rinse mouth afterwards. 12/01/20   Ellamae Sia, DO  Calcium 500 MG tablet Take by mouth.     [provider]  cetirizine (ZYRTEC) 10 MG tablet TAKE 1 TABLET(10 MG) BY MOUTH DAILY FOR ALLERGIES 06/07/21   Ellamae Sia, DO  Cholecalciferol (VITAMIN D3 PO) Take by mouth.    [provider]  docusate sodium (COLACE) 100 MG capsule Take 100 mg by mouth daily.    [provider]  enalapril (VASOTEC) 10 MG tablet Frequency:   Dosage:0.0     Instructions:  Note: 03/11/11   [provider]  estradiol (ESTRACE) 1 MG tablet TAKE 1 TABLET(1 MG) BY MOUTH DAILY 05/06/20   [provider]  famotidine (PEPCID) 40 MG tablet TAKE 1 TABLET BY MOUTH IN THE EVENING FOR REFLUX 09/27/21   Ambs, Norvel Richards, FNP  fluticasone (FLONASE) 50 MCG/ACT nasal spray Place 1 spray into both nostrils 2 (two) times daily as needed (for nasal congestion). 12/01/20   Ellamae Sia, DO  glucosamine-chondroitin 500-400 MG tablet Take 1 tablet by mouth 2 (two) times daily.    [provider]  GLUCOSAMINE-CHONDROITIN PO Take by mouth.    [provider]  glucose blood (ONETOUCH ULTRA) test strip  09/05/19   [provider]  labetalol (NORMODYNE) 200 MG tablet Take two tablets twice daily. 06/11/18   [provider]  Lancets (ONETOUCH DELICA PLUS LANCET30G) MISC USE 1 DAILY AS DIRECTED 10/17/20   [provider]  MAGNESIUM PO Take by mouth.    [provider]  metFORMIN (GLUCOPHAGE) 1000 MG tablet Take 1,000 mg by mouth daily. 05/31/20   [provider]  montelukast (SINGULAIR) 10 MG tablet Take 1 tablet (10 mg total) by mouth at bedtime. 02/01/21   Hetty Blend, FNP  omeprazole (PRILOSEC) 40 MG capsule Take by mouth. 07/25/16   [provider]      Allergies    Pedi-pre tape spray [wound dressing adhesive]    Review of Systems   Review of Systems  Constitutional:  Negative for chills and fever.  HENT:  Negative for ear pain and sore throat.   Eyes:  Negative for pain and visual disturbance.  Respiratory:  Negative for  cough and shortness of breath.   Cardiovascular:  Negative for chest pain and palpitations.  Gastrointestinal:  Negative for abdominal pain and vomiting.  Genitourinary:  Negative for dysuria and hematuria.  Musculoskeletal:  Negative for arthralgias, back pain and joint swelling.  Skin:  Negative for color change and rash.  Neurological:  Negative for seizures and syncope.  All other systems reviewed and are negative.   Physical Exam Updated Vital Signs BP (!) 193/85 (BP Location: Right Arm)   Pulse 65   Temp 98.2 F (36.8 C)   Resp 20   Ht 1.499 m (4\' 11" )   Wt 89.8 kg   SpO2 99%   BMI 39.99 kg/m  Physical Exam Vitals and nursing note reviewed.  Constitutional:      General: She is not in acute distress.    Appearance: Normal appearance. She is well-developed.  HENT:     Head: Normocephalic and atraumatic.  Eyes:     Extraocular Movements: Extraocular movements intact.     Conjunctiva/sclera: Conjunctivae normal.     Pupils: Pupils are equal, round, and reactive to light.  Cardiovascular:     Rate and Rhythm: Normal rate and regular rhythm.     Heart sounds: No murmur heard. Pulmonary:     Effort: Pulmonary effort is normal. No respiratory distress.     Breath sounds: Normal breath sounds.  Abdominal:     Palpations: Abdomen is soft.     Tenderness: There is no abdominal tenderness.  Musculoskeletal:        General: No swelling, tenderness or deformity.     Cervical back: Normal range of motion and neck supple.     Right lower leg: Edema present.     Left lower leg: Edema present.     Comments: Some mild edema to both lower extremities.  Right knee without any swelling incisions well-healed.  No erythema.  Some discomfort with movement.  Distally neurovascularly intact.  Skin:    General: Skin is warm and dry.     Capillary Refill: Capillary refill takes less than 2 seconds.  Neurological:     General: No focal deficit present.     Mental Status: She is alert  and oriented to person, place, and time.  Psychiatric:        Mood and Affect: Mood normal.     ED Results / Procedures / Treatments   Labs (all labs ordered are listed, but only abnormal results are displayed) Labs Reviewed - No data to display  EKG None  Radiology No results found.  Procedures Procedures    Medications Ordered in ED Medications  HYDROcodone-acetaminophen (NORCO/VICODIN) 5-325 MG per tablet 1 tablet (has no administration in time range)    ED Course/ Medical Decision Making/ A&P  Medical Decision Making Amount and/or Complexity of Data Reviewed Radiology: ordered.  Risk Prescription drug management.   X-ray of right knee pending but reviewed by me.  No obvious fractures or dislocations.  Sounds as if patient has been having some difficulty with the right knee status post total knee replacement approximately 10 years ago.  Will treat with knee immobilizer and pain medicine.  Does not fit into any clinical scenario for sciatica.  Also no evidence of any joint infection clinically.  X-ray of the right knee no acute findings status post right knee arthroplasty.  Will continue with current plan.  Chart review did show that patient had a prescription for tramadol that she picked up on November 1.  That was for 7 days.  She said the tramadol did not help much.   Final Clinical Impression(s) / ED Diagnoses Final diagnoses:  Acute pain of right knee    Rx / DC Orders ED Discharge Orders     None         Vanetta Mulders, MD 06/24/23 1610    Vanetta Mulders, MD 06/24/23 1003

## 2023-06-24 NOTE — Discharge Instructions (Addendum)
Use knee immobilizer as needed for comfort.  Can take it off to shower.  Take the pain medicine as directed.  Return for any new or worse symptoms.  Keep your appointment with your orthopedic doctor as scheduled.

## 2023-06-24 NOTE — ED Triage Notes (Signed)
Rt knee pain after having a shotting pain that knee yesterday  no injury ,felt a pop it is a burning feeling , has hx of knee replacement that side

## 2024-03-18 LAB — COLOGUARD: COLOGUARD: NEGATIVE

## 2024-07-11 ENCOUNTER — Emergency Department (HOSPITAL_BASED_OUTPATIENT_CLINIC_OR_DEPARTMENT_OTHER)
Admission: EM | Admit: 2024-07-11 | Discharge: 2024-07-11 | Disposition: A | Discharge status: Home / Self Care | Attending: Emergency Medicine | Admitting: Emergency Medicine

## 2024-07-11 ENCOUNTER — Emergency Department (HOSPITAL_BASED_OUTPATIENT_CLINIC_OR_DEPARTMENT_OTHER)

## 2024-07-11 ENCOUNTER — Other Ambulatory Visit: Payer: Self-pay

## 2024-07-11 ENCOUNTER — Encounter (HOSPITAL_BASED_OUTPATIENT_CLINIC_OR_DEPARTMENT_OTHER): Payer: Self-pay

## 2024-07-11 DIAGNOSIS — Z96651 Presence of right artificial knee joint: Secondary | ICD-10-CM | POA: Diagnosis not present

## 2024-07-11 DIAGNOSIS — S8391XA Sprain of unspecified site of right knee, initial encounter: Secondary | ICD-10-CM | POA: Insufficient documentation

## 2024-07-11 DIAGNOSIS — Z79899 Other long term (current) drug therapy: Secondary | ICD-10-CM | POA: Insufficient documentation

## 2024-07-11 DIAGNOSIS — I10 Essential (primary) hypertension: Secondary | ICD-10-CM | POA: Diagnosis not present

## 2024-07-11 DIAGNOSIS — S8991XA Unspecified injury of right lower leg, initial encounter: Secondary | ICD-10-CM | POA: Diagnosis present

## 2024-07-11 DIAGNOSIS — W108XXA Fall (on) (from) other stairs and steps, initial encounter: Secondary | ICD-10-CM | POA: Diagnosis not present

## 2024-07-11 DIAGNOSIS — Z7982 Long term (current) use of aspirin: Secondary | ICD-10-CM | POA: Diagnosis not present

## 2024-07-11 MED ORDER — HYDROCODONE-ACETAMINOPHEN 5-325 MG PO TABS: 1.0000 | ORAL_TABLET | Freq: Four times a day (QID) | ORAL | 0 refills | Status: AC | PRN

## 2024-07-11 NOTE — ED Provider Notes (Signed)
 [place] [facility] Provider Note   CSN: [id number] Arrival date & time: 07/11/24  9186     Patient presents with: Rhonda Gray is a 73 y.o. female.    Fall  Patient with right knee pain.  States right knee gave away yesterday.  Also had another episode a week ago with similar symptoms.  States does have some pain in the knee and told she has arthritis.  Has had a previous knee replacement however.  Reviewing notes.  She saw orthopedic surgery and had a bursitis with a steroid injection 2-1/2 weeks ago.    Past Medical History:  Diagnosis Date   Hypertension    Past Surgical History:  Procedure Laterality Date   ABDOMINAL HYSTERECTOMY     CATARACT EXTRACTION, BILATERAL Bilateral    FOOT SURGERY Left    REPLACEMENT TOTAL KNEE Right     Prior to Admission medications   Medication Sig Start Date End Date Taking? Authorizing Provider  albuterol  (VENTOLIN  HFA) 108 (90 Base) MCG/ACT inhaler Inhale 2 puffs into the lungs every 4 (four) hours as needed for wheezing or shortness of breath (coughing fits). 12/01/20   Luke Orlan HERO, DO  amLODipine  (NORVASC ) 10 MG tablet Take 1 tablet by mouth every evening. 08/25/16   [provider]  aspirin  81 MG EC tablet Take 81 mg by mouth every evening.    [provider]  atorvastatin  (LIPITOR) 40 MG tablet  09/06/16   [provider]  budesonide -formoterol  (SYMBICORT ) 80-4.5 MCG/ACT inhaler Inhale 2 puffs into the lungs in the morning and at bedtime. with spacer and rinse mouth afterwards. 12/01/20   Luke Orlan HERO, DO  Calcium  500 MG tablet Take by mouth.    [provider]  cetirizine  (ZYRTEC ) 10 MG tablet TAKE 1 TABLET(10 MG) BY MOUTH DAILY FOR ALLERGIES 06/07/21   Luke Orlan HERO, DO  Cholecalciferol (VITAMIN D3 PO) Take by mouth.    [provider]  docusate sodium  (COLACE) 100 MG capsule Take 100 mg by mouth daily.    [provider]  enalapril   (VASOTEC ) 10 MG tablet Frequency:   Dosage:0.0     Instructions:  Note: 03/11/11   [provider]  estradiol (ESTRACE) 1 MG tablet TAKE 1 TABLET(1 MG) BY MOUTH DAILY 05/06/20   [provider]  famotidine  (PEPCID ) 40 MG tablet TAKE 1 TABLET BY MOUTH IN THE EVENING FOR REFLUX 09/27/21   Ambs, Arlean HERO, FNP  fluticasone  (FLONASE ) 50 MCG/ACT nasal spray Place 1 spray into both nostrils 2 (two) times daily as needed (for nasal congestion). 12/01/20   Luke Orlan HERO, DO  glucosamine-chondroitin 500-400 MG tablet Take 1 tablet by mouth 2 (two) times daily.    [provider]  GLUCOSAMINE-CHONDROITIN PO Take by mouth.    [provider]  glucose blood (ONETOUCH ULTRA) test strip  09/05/19   [provider]  HYDROcodone -acetaminophen  (NORCO/VICODIN) 5-325 MG tablet Take 1 tablet by mouth every 6 (six) hours as needed for moderate pain (pain score 4-6). 07/11/24   Patsey Lot, MD  labetalol  (NORMODYNE ) 200 MG tablet Take two tablets twice daily. 06/11/18   [provider]  Lancets (ONETOUCH DELICA PLUS LANCET30G) MISC USE 1 DAILY AS DIRECTED 10/17/20   [provider]  MAGNESIUM PO Take by mouth.    [provider]  metFORMIN  (GLUCOPHAGE ) 1000 MG tablet Take 1,000 mg by mouth daily. 05/31/20   [provider]  montelukast  (SINGULAIR ) 10 MG  tablet Take 1 tablet (10 mg total) by mouth at bedtime. 02/01/21   Cari Arlean HERO, FNP  omeprazole (PRILOSEC) 40 MG capsule Take by mouth. 07/25/16   [provider]    Allergies: Pedi-pre tape spray [wound dressing adhesive]    Review of Systems  Updated Vital Signs BP (!) 165/69   Pulse 79   Temp 98.6 F (37 C) (Oral)   Resp 18   SpO2 99%   Physical Exam Vitals reviewed.  Musculoskeletal:     Comments: Scar on left knee from previous knee replacement.  Does have an abrasion at the inferior end of the scar.  Does have some edema distally.  Tenderness over the knee.  Pain  laterally on the knee and pain with valgus strain.  Neurological:     Mental Status: She is alert.     (all labs ordered are listed, but only abnormal results are displayed) Labs Reviewed - No data to display  EKG: None  Radiology: DG Knee Complete 4 Views Right Result Date: 07/11/2024 CLINICAL DATA:  Fall down stairs.  Right knee pain and swelling. EXAM: RIGHT KNEE - COMPLETE 4+ VIEW COMPARISON:  06/24/2023 FINDINGS: Total knee arthroplasty again seen with all 3 components in expected position. No evidence of fracture, dislocation, or joint effusion. No evidence of arthropathy or other focal bone abnormality. Anterior infrapatellar soft tissue swelling is noted. Extensive peripheral vascular calcification also seen. IMPRESSION: Anterior infrapatellar soft tissue swelling. No evidence of fracture or dislocation. Electronically Signed   By: Norleen DELENA Kil M.D.   On: 07/11/2024 08:51     Procedures   Medications Ordered in the ED - No data to display                                  Medical Decision Making Amount and/or Complexity of Data Reviewed Radiology: ordered.  Risk Prescription drug management.   Patient with knee pain.  Has had previous knee replacement but also has had a bursitis.  New injury with fall.  Will get x-ray to evaluate.  Reviewed Ortho note.  X-ray does not show fracture.  Knee immobilizer for comfort.  Will give pain medicines.  Has been on tramadol previously.  Stop and go to Vicodin.  Discharge home.     Final diagnoses:  Sprain of right knee, unspecified ligament, initial encounter    ED Discharge Orders          Ordered    HYDROcodone -acetaminophen  (NORCO/VICODIN) 5-325 MG tablet  Every 6 hours PRN        07/11/24 0927               Patsey Lot, MD 07/11/24 [phone]

## 2024-07-11 NOTE — ED Triage Notes (Signed)
 Pt presents with complaints of a mechanical fall. Reports was coming down the stairs when knee gave way. Reports right knee pain. No LOC, no thinners. Denies other symptoms.

## 2024-07-11 NOTE — ED Notes (Signed)
 Pt alert and oriented X 4 at the time of discharge. RR even and unlabored. No acute distress noted. Pt verbalized understanding of discharge instructions as discussed. Pt ambulatory to lobby at time of discharge.

## 2024-07-11 NOTE — Discharge Instructions (Addendum)
 Follow-up with your orthopedic surgeon.
